# Patient Record
Sex: Female | Born: 1985 | Race: Black or African American | Hispanic: No | Marital: Single | Smoking: Never smoker
Health system: Southern US, Community
[De-identification: ages and names within clinical notes are randomized; demographics above are authoritative.]

## PROBLEM LIST (undated history)

## (undated) DIAGNOSIS — N319 Neuromuscular dysfunction of bladder, unspecified: Secondary | ICD-10-CM

## (undated) DIAGNOSIS — Q059 Spina bifida, unspecified: Secondary | ICD-10-CM

## (undated) DIAGNOSIS — R2689 Other abnormalities of gait and mobility: Secondary | ICD-10-CM

## (undated) DIAGNOSIS — H04129 Dry eye syndrome of unspecified lacrimal gland: Secondary | ICD-10-CM

## (undated) DIAGNOSIS — E559 Vitamin D deficiency, unspecified: Secondary | ICD-10-CM

## (undated) DIAGNOSIS — N3281 Overactive bladder: Secondary | ICD-10-CM

## (undated) DIAGNOSIS — E612 Magnesium deficiency: Secondary | ICD-10-CM

## (undated) DIAGNOSIS — R633 Feeding difficulties, unspecified: Secondary | ICD-10-CM

## (undated) DIAGNOSIS — R41841 Cognitive communication deficit: Secondary | ICD-10-CM

## (undated) DIAGNOSIS — K219 Gastro-esophageal reflux disease without esophagitis: Secondary | ICD-10-CM

## (undated) DIAGNOSIS — M6281 Muscle weakness (generalized): Secondary | ICD-10-CM

## (undated) DIAGNOSIS — R278 Other lack of coordination: Secondary | ICD-10-CM

## (undated) DIAGNOSIS — R471 Dysarthria and anarthria: Secondary | ICD-10-CM

## (undated) DIAGNOSIS — R1310 Dysphagia, unspecified: Secondary | ICD-10-CM

## (undated) HISTORY — DX: Feeding difficulties, unspecified: R63.30

## (undated) HISTORY — PX: SHUNT REPLACEMENT: SHX5403

## (undated) HISTORY — PX: EYE SURGERY: SHX253

## (undated) HISTORY — DX: Dysphagia, unspecified: R13.10

## (undated) HISTORY — PX: BLADDER SURGERY: SHX569

## (undated) HISTORY — DX: Gastro-esophageal reflux disease without esophagitis: K21.9

## (undated) HISTORY — DX: Overactive bladder: N32.81

## (undated) HISTORY — DX: Vitamin D deficiency, unspecified: E55.9

## (undated) HISTORY — DX: Neuromuscular dysfunction of bladder, unspecified: N31.9

## (undated) HISTORY — DX: Dry eye syndrome of unspecified lacrimal gland: H04.129

## (undated) HISTORY — DX: Feeding difficulties: R63.3

## (undated) HISTORY — DX: Cognitive communication deficit: R41.841

## (undated) HISTORY — DX: Spina bifida, unspecified: Q05.9

## (undated) HISTORY — PX: BACK SURGERY: SHX140

## (undated) HISTORY — DX: Other abnormalities of gait and mobility: R26.89

## (undated) HISTORY — DX: Dysarthria and anarthria: R47.1

## (undated) HISTORY — DX: Muscle weakness (generalized): M62.81

## (undated) HISTORY — DX: Other lack of coordination: R27.8

## (undated) HISTORY — PX: FOOT SURGERY: SHX648

## (undated) HISTORY — DX: Magnesium deficiency: E61.2

---
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
## 2007-05-31 IMAGING — CR DG CHEST 1V PORT
1 series · 1 of 1 positions shown · non-contrast
Comparison: 10/26/05.

CLINICAL DATA: 20 year old female; fever.
 PORTABLE CHEST - 1 VIEW - 01/25/06:

[view not recorded]
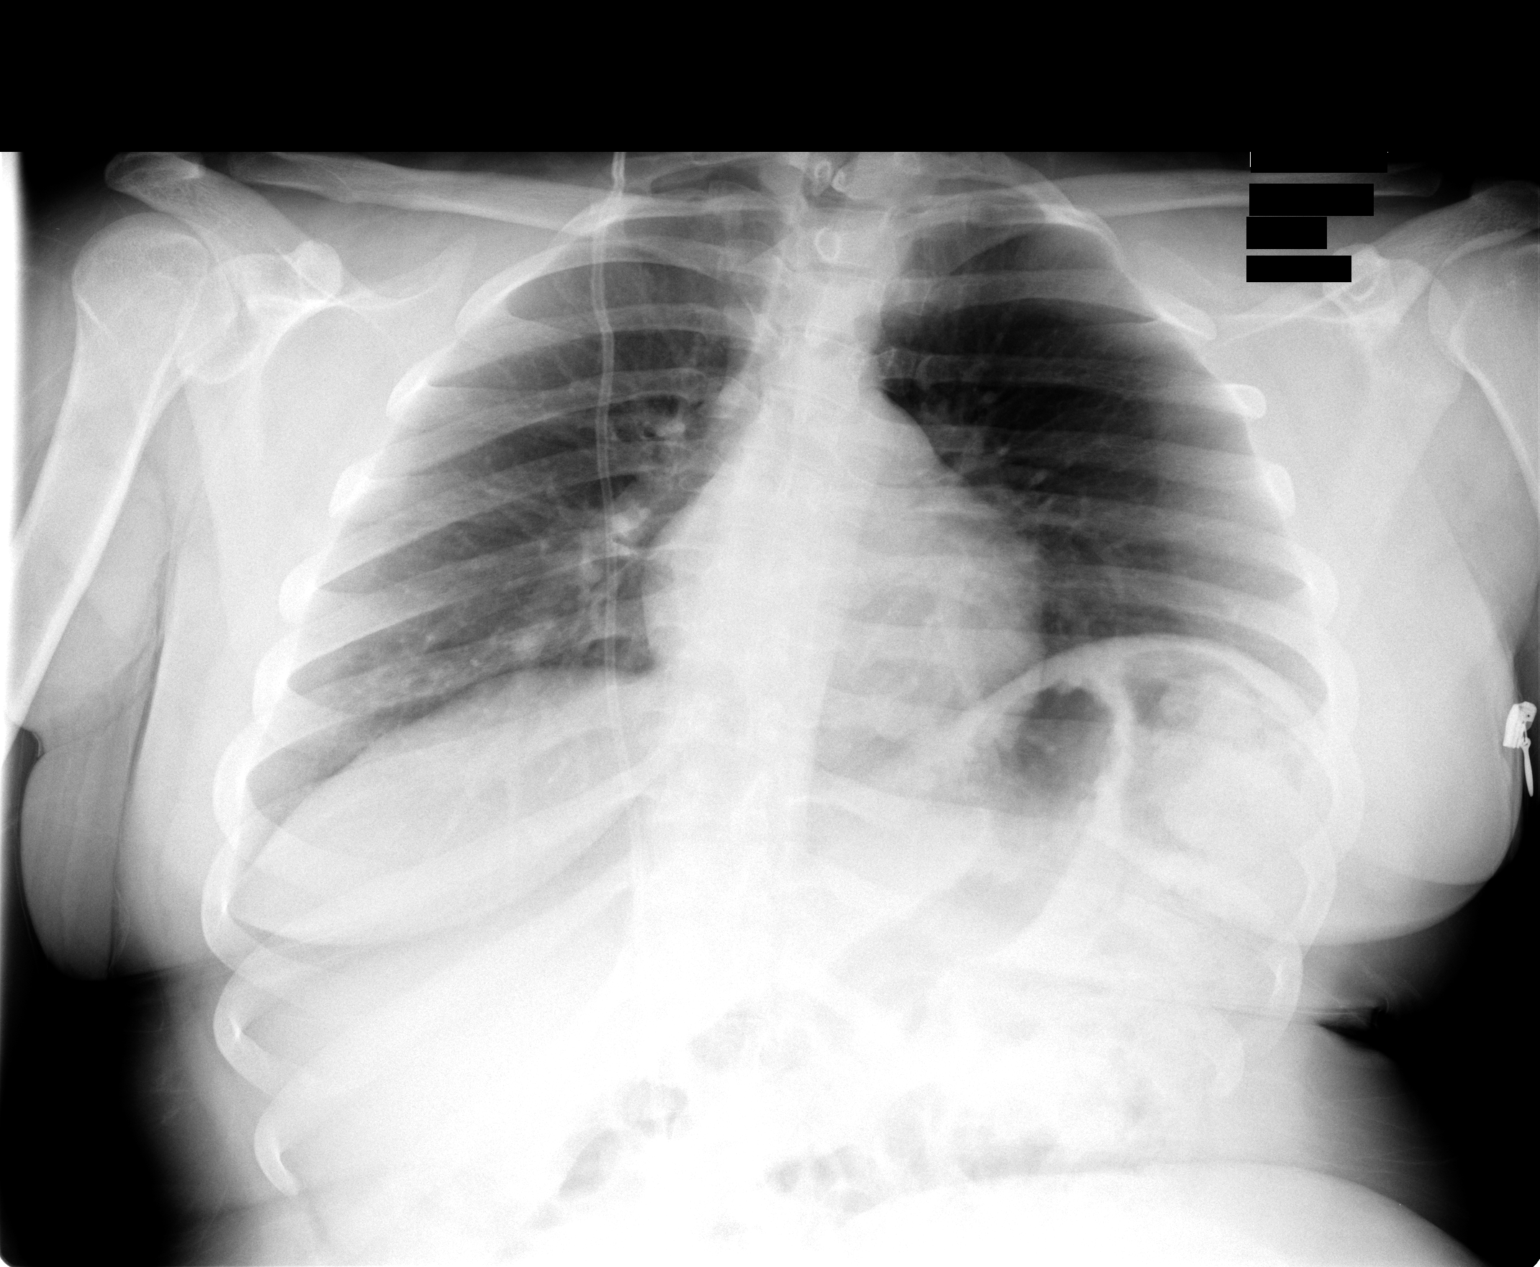

[1 of 1 positions shown; findings below may reference images not displayed]

FINDINGS: The cardiopericardial silhouette is within normal limits for size.   The right-sided PICC line has been removed.  There is ill-defined air space disease involving the right lower lobe.  No other focal air space disease is evident.  The visualized soft tissues and bony thorax are unremarkable.
IMPRESSION: 1.  Ill-defined air space disease in the right lower lobe may represent early infection versus atelectasis.
 2.  V-P shunt catheter in place.
 3.  Interval removal of right-sided PICC line.

## 2007-06-13 ENCOUNTER — Emergency Department (HOSPITAL_COMMUNITY): Admission: EM | Admit: 2007-06-13 | Discharge: 2007-06-13 | Payer: Self-pay | Admitting: Emergency Medicine

## 2007-06-24 ENCOUNTER — Encounter (HOSPITAL_BASED_OUTPATIENT_CLINIC_OR_DEPARTMENT_OTHER): Admission: RE | Admit: 2007-06-24 | Discharge: 2007-09-22 | Payer: Self-pay | Admitting: Surgery

## 2008-11-09 ENCOUNTER — Encounter (HOSPITAL_COMMUNITY): Admission: RE | Admit: 2008-11-09 | Discharge: 2008-12-09 | Payer: Self-pay | Admitting: Pulmonary Disease

## 2008-12-29 ENCOUNTER — Encounter (HOSPITAL_COMMUNITY): Admission: RE | Admit: 2008-12-29 | Discharge: 2009-01-28 | Payer: Self-pay | Admitting: Pulmonary Disease

## 2009-07-08 ENCOUNTER — Encounter (HOSPITAL_COMMUNITY): Admission: RE | Admit: 2009-07-08 | Discharge: 2009-08-07 | Payer: Self-pay | Admitting: Pulmonary Disease

## 2009-08-09 ENCOUNTER — Encounter (HOSPITAL_COMMUNITY): Admission: RE | Admit: 2009-08-09 | Discharge: 2009-09-08 | Payer: Self-pay | Admitting: Pulmonary Disease

## 2009-09-09 ENCOUNTER — Encounter (HOSPITAL_COMMUNITY): Admission: RE | Admit: 2009-09-09 | Discharge: 2009-10-09 | Payer: Self-pay | Admitting: Pulmonary Disease

## 2009-10-14 ENCOUNTER — Emergency Department (HOSPITAL_COMMUNITY): Admission: EM | Admit: 2009-10-14 | Discharge: 2009-10-14 | Payer: Self-pay | Admitting: Emergency Medicine

## 2010-06-18 ENCOUNTER — Encounter: Payer: Self-pay | Admitting: Surgery

## 2010-06-19 ENCOUNTER — Encounter: Payer: Self-pay | Admitting: Surgery

## 2010-10-11 NOTE — Assessment & Plan Note (Signed)
[facility]   NAME:  Kristen Stewart, Kristen Stewart NO.:  [id number]   MEDICAL RECORD NO.:  [id number]      DATE OF BIRTH:  1986/04/09   PHYSICIAN:  Theresia Majors. Tanda Stewart, M.D.      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  In the interim, we  treated her with off-loading and Iodosorb gel.  In the interim, there  has been no excessive drainage, malodor, pain, or fever.  She returns  for followup.  She is accompanied by her attendant.   OBJECTIVE:  Blood pressure is 106/64, respirations 16, pulse rate 77,  temperature 98.3.  Inspection of the ulcer shows that there is a punctate area of recent  epithelization.  The wound is 100% epithelized.  There is a continuation  of the Iodosorb, which is impacted in desquamation.  The wound was left  undisturbed.  There is no evidence of abscess or ascending cellulitis or  lymphangitis.   ASSESSMENT:  Resolved neuropathic ulcer.   PLAN:  We are discharging the patient with instructions that she is to  continue to use the bulky wrap and the protective off-loading sandal.  Particular attention should be taken during night to place a pillow in  such a way as to avoid unusual friction.  We have given the patient and  her attendant an opportunity to ask questions.  They seemed to  understand.  They expressed gratitude for having been seen in the  clinic.  The patient is discharged.      Kristen Stewart, M.D.  Electronically Signed     HAN/MEDQ  D:  08/15/2007  T:  08/15/2007  Job:  [id number]   cc:   Kristen Stewart, M.D.

## 2010-10-11 NOTE — Assessment & Plan Note (Signed)
[facility]   NAME:  Kristen Stewart, Kristen Stewart NO.:  [id number]   MEDICAL RECORD NO.:  [id number]      DATE OF BIRTH:  Oct 15, 1985   PHYSICIAN:  Theresia Majors. Tanda Rockers, M.D. VISIT DATE:  07/11/2007                                   OFFICE VISIT   SUBJECTIVE:  Kristen Stewart is a 25 year old lady who we are treating  for neuropathic ulcer involving the left hallux.  In the interim she has  been treated with daily antiseptic soap wash and topical Iodosorb with a  bulky dressing.  There has been no fever or excessive drainage.   OBJECTIVE:  Blood pressure is 118/67, respirations 16, pulse rate 90,  temperature 98.1.  Inspection of the left foot shows that the ulcer is  essentially unchanged.  There is a halo of callus which was pared  without difficulty.  The wound itself is 100% granulated with scant  advancing epithelium.  There is no excessive edema.  The pedal pulse is  readily palpable.  There is brisk capillary refill.   ASSESSMENT:  Inadequate offloading, neuropathic ulcer.   PLAN:  We have instructed the caretaker to be vigilant in avoiding  pressure and dressing changes to ensure that there is adequate bulk to  provide protection.  We have renewed the prescription for Iodosorb.  We  will  reevaluate the patient in one month.      Harold A. Tanda Rockers, M.D.  Electronically Signed     HAN/MEDQ  D:  07/11/2007  T:  07/13/2007  Job:  [id number]

## 2010-10-11 NOTE — Assessment & Plan Note (Signed)
[facility]   NAME:  RYAN, PALERMO NO.:  [id number]   MEDICAL RECORD NO.:  [id number]      DATE OF BIRTH:  18-Jan-1986   PHYSICIAN:  Theresia Majors. Tanda Rockers, M.D. VISIT DATE:  06/26/2007                                   OFFICE VISIT   SUBJECTIVE:  Ms. Kristen Stewart is a 25 year old who was last seen in the  Wound Center in November 2007.  At that time she was being treated for  recalcitrant osteomyelitis.  The patient was referred to [facility]  and underwent rotational flap surgery with complete resolution of this  problem.  She returns with an ulceration which has developed on the left  hallux that this been present for approximately 3 weeks.  It has been  treated with a local cleansing and a topical tri-antibiotic ointment.  There has been no associated fever.  She continues in a group residence.   OBJECTIVE:  Blood pressure is 110/63, respirations 18, pulse rate 85,  temperature is 98.2.  She is accompanied by her caretaker.  Examination of the lower extremities shows atrophic changes consistent  with her spina bifida.  On the left hallux, there is a well-  circumscribed ulcer at the interphalangeal joint laterally.  It is  surrounded by a preponderance of callus and nonviable necrotic tissue.  The central portion of the wound appears to be fully granulated.  There  is moist exudate.  There is no a ascending infection.  The pedal pulse  is readily palpable.  The patient is insensate.  An excisional  debridement of nonviable tissue including skin and the subcutaneous area  was performed without difficulty.  Minimum hemorrhage was stimulated and  controlled with pressure.   ASSESSMENT:  Neuropathic ulcer.   PLAN:  We will continue antiseptic soap washing daily with topical  application of Iodosorb.  We will place the patient in the healing  sandal with a bulky dressing to avoid pressure and friction.  We have  explained this dressing  technique to the caretaker.  It has been  demonstrated by the nurse.  We have given the patient and the care taker  an opportunity to ask questions.  They seem to understand and indicate  that they will be compliant.  We will reevaluate the patient in 2 weeks.      Harold A. Tanda Rockers, M.D.  Electronically Signed     HAN/MEDQ  D:  06/26/2007  T:  06/26/2007  Job:  [id number]   cc:   Ramon Dredge L. Juanetta Gosling, M.D.

## 2010-10-14 NOTE — H&P (Signed)
NAME:  Kristen Stewart, Kristen Stewart           ACCOUNT NO.:  [id number]   MEDICAL RECORD NO.:  [id number]          PATIENT TYPE:  INP   LOCATION:  A302                          FACILITY:  [facility]   PHYSICIAN:  Melvyn Novas, MDDATE OF BIRTH:  [birth year]   DATE OF ADMISSION:  05/21/2006  DATE OF DISCHARGE:  LH                              HISTORY & PHYSICAL   The patient is an 25 year old black female with spina bifida, chronic  and recurrent UTIs with indwelling Foley, possible neurogenic bladder  who apparently was admitted 10 days ago for UTI and urosepsis and placed  on Cipro and then developed some diarrhea and was 3 days ago placed on  Flagyl presumably to cover C. difficile.  She continues to have  intractable diarrhea, several episodes per hour.  She denies any nausea,  vomiting, melena, hematochezia or hematemesis.  The patient has blood  pressure of 98 systolic and will be admitted for control of intractable  diarrhea, culture of stools and aggressive antidiarrheal therapy as well  as intravenous fluid repletion.  She still has some chronic dysuria,  although she has an indwelling Foley.  We will cease Cipro at present.   PAST MEDICAL HISTORY:  1. Significant for spina bifida.  2. Neurogenic bladder.  3. Chronic recurrent UTIs.  4. GERD.  5. Dyspepsia.   PAST SURGICAL HISTORY:  1. Remarkable for surgery on her left eye at birth.  2. Cystoscopy within the last several years.   She has no known allergies.   CURRENT MEDICATIONS:  1. Baclofen 10 mg q.8h.  2. Zantac 150 p.o. b.i.d.  3. Cipro 500 b.i.d.  4. Flagyl 500 mg b.i.d.   Blood pressure is 98/56, respiratory rate of 16, pulse of 88, O2  saturation is 98%.  Eyes are PERRLA.  Extraocular movements intact.  Sclerae clear.  Conjunctivae pink.  Throat shows no erythema and no  exudates.  Tongue has a somewhat dry mucosa.  Neck shows no JVD, no  carotid bruits, no thyromegaly and no thyroid bruits.  Lungs are clear  to  A&P.  No rales, wheezes or rhonchi appreciable.  Heart regular rate  and rhythm.  No murmurs, gallops, heaves, thrills or rubs.  Abdomen is  obese, soft, nontender bowel sounds.  No hepatosplenomegaly, no masses.  Extremities show no clubbing, cyanosis, or edema.   IMPRESSION:  1. Intractable diarrhea.  2. Status post urinary tract infection with Cipro and the addition of      Flagyl.  3. Consider opportunistic infection such as Clostridium difficile.  4. Spina bifida.  5. Chronic recurring urinary tract infection with indwelling catheter.  6. Neurogenic bladder.   The plan is to admit, get stool cultures, give Lomotil 1 every 4 hours  p.r.n. IV and normal saline.  Get serial BMETs daily.  Culture stools  and a regular diet as tolerated.  I will make further recommendations as  the data base expands.      Melvyn Novas, MD  Electronically Signed     RMD/MEDQ  D:  05/21/2006  T:  05/22/2006  Job:  [id number]

## 2010-10-14 NOTE — Group Therapy Note (Signed)
NAME:  CARINE, NORDGREN           ACCOUNT NO.:  [id number]   MEDICAL RECORD NO.:  [id number]          PATIENT TYPE:  INP   LOCATION:  A302                          FACILITY:  [facility]   PHYSICIAN:  Edward L. Juanetta Gosling, M.D.DATE OF BIRTH:  December 08, 1985   DATE OF PROCEDURE:  DATE OF DISCHARGE:                                 PROGRESS NOTE   SUBJECTIVE:  Ms. Regas says she is feeling better.  She is having  formed stools now.  Thus far her studies for C diff, etc. are normal.   PHYSICAL EXAMINATION:  GENERAL:  Her physical exam today shows she is  awake and alert.  VITAL SIGNS:  Temperature is 98, pulse 89, respirations 22, blood  pressure 101/54, O2 sats 99%.  ABDOMEN:  Soft.  CHEST:  Clear.  EXTREMITIES:  No edema.   ASSESSMENT:  She is better.   PLAN:  Continue treatments.  I am going to discuss with Dr. Kendell Bane since  she is resolving her diarrhea whether we need to continue antibiotics or  exactly what we need to do.      Edward L. Juanetta Gosling, M.D.  Electronically Signed     ELH/MEDQ  D:  05/24/2006  T:  05/24/2006  Job:  [id number]

## 2010-10-14 NOTE — Consult Note (Signed)
NAME:  Kristen Stewart, Kristen Stewart           ACCOUNT NO.:  [id number]   MEDICAL RECORD NO.:  [id number]          PATIENT TYPE:  INP   LOCATION:  A302                          FACILITY:  [facility]   PHYSICIAN:  R. Roetta Sessions, M.D. DATE OF BIRTH:  06/15/1985   DATE OF CONSULTATION:  DATE OF DISCHARGE:                                 CONSULTATION   REASON FOR CONSULTATION:  Diarrhea.   HISTORY OF PRESENT ILLNESS:  Patient is an 25 year old African-American  female with history of spina bifida and neurogenic bladder who has  recurrent urinary tract infections.  She had 1 earlier this month, she  was placed on antibiotics.  She then began to complain of diarrhea which  began around December 16.  She has had watery diarrhea with stomach  cramping 4 to 5 times per day since that time.  She has some complaints  of left upper quadrant, just below the left rib cage, pain.  She denies  any nausea, she did vomit one time.  She has had a low grade temp up to  102 but this began with urinary tract infection and was better when she  started antibiotics.  She has history of constipation previously.  Stool  culture and C-diff from May 18, 2006 was negative.  She was started  on Flagyl for a couple of days prior to hospital admission.  She has not  had any further diarrhea since she has been admitted.   PAST MEDICAL/SURGICAL HISTORY:  Spina bifida, neurogenic bladder with  recurrent urinary tract infections, she has an indwelling catheter,  GERD, dyspepsia, left eye surgery, she is status post cystoscopy, on  December 14 she had blood transfusion, she was started on iron, she has  a history of previous constipation.   MEDICATIONS PRIOR TO ADMISSION:  __________ 5 mg daily, ranitidine 150  mg b.i.d., multi-vitamin daily, baclofen 10 mg p.r.n.   ALLERGIES:  __________ .   FAMILY HISTORY:  Mother is alive at age 19, she is a substance abuser.  Patient does not have contact with her father, patient does  not know  history.  She has 4 healthy siblings.  There is no family history of  inflammatory bowel disease.   SOCIAL HISTORY:  Patient lives with her brother and his wife.  She is  single, denies any tobacco or alcohol or drug use.   REVIEW OF SYSTEMS:  CONSTITUTIONAL:  She has daily gained some weight  since she has moved in with her brother, she was previously underweight.  CARDIOVASCULAR:  She is having some left lower anterior chest pain,  denies any palpitations.  Denies any dizziness or diaphoresis.  PULMONARY:  Denies any shortness of breath, cough or hemoptysis.  GASTROINTESTINAL:  See HPI.  Has a history of heartburn, well controlled  on ranitidine.  Denies any dysphagia, odynophagia, anorexia or early  satiety.  GYN:  Last menstrual period was on December 23.   PHYSICAL EXAM:  VITAL SIGNS:  Weight, actually I don't have a weight on  her.  Temp  98.1, pulse 89, respirations 22, blood pressure 108/73, O2  sat's 99% on room air.  GENERAL:  Patient is alert and oriented.  Well developed female in no  acute distress.  HEENT:  __________  NECK:  Supple without masses.  CHEST:  HEART:  Regular rate and rhythm, normal S1/S2, no murmurs or gallops.  LUNGS:  Clear to auscultation bilaterally.  ABDOMEN:  Positive bowel sounds.  She does have a __________ around the  umbilicus consistent with previous surgeries.  Abdomen is soft,  nontender, nondistended without palpable masses or guarding.  EXTREMITIES:  Lower extremities are unequal, they are without edema or  clubbing.   LABORATORY STUDIES:  From May 21, 2006:  WBC's 4.5, hemoglobin 1.8,  hematocrit 38, platelets 604 from December 25, calcium 8.3, sodium 140,  potassium 4.3, chloride 109, CO2 of 25, BUN 6, creatinine 0.5 and  glucose 134.   IMPRESSION:  This is an 25 year old African-American female with history  of neurogenic bladder and spina bifida with recent urinary tract  infection treated on antibiotics.  She has  had a 10 day history of  watery diarrhea 4 to 5 loose nonbloody stools a day.  Status post  __________ she has had 1 C-difficile from December 21 which was  negative.  I suspect she either has antibiotic induced diarrhea or  __________ but cannot rule out inflammatory bowel disease at this time.   PLAN:  1. We will follow up with stool studies.  2. If diarrhea persists, she is going to need a colonoscopy.  3. Supportive measures.  4. I want to thank Dr. Juanetta Gosling for allowing [place] to participate in the      care of this patient.      Nicholas Lose, N.P.      Jonathon Bellows, M.D.  Electronically Signed    KC/MEDQ  D:  05/23/2006  T:  05/23/2006  Job:  [id number]   cc:   Ramon Dredge L. Juanetta Gosling, M.D.  Fax: [phone]

## 2010-10-14 NOTE — Discharge Summary (Signed)
NAME:  Kristen Stewart, Kristen Stewart           ACCOUNT NO.:  [id number]   MEDICAL RECORD NO.:  [id number]          PATIENT TYPE:  INP   LOCATION:  A302                          FACILITY:  [facility]   PHYSICIAN:  Edward L. Juanetta Gosling, M.D.DATE OF BIRTH:  03-12-1986   DATE OF ADMISSION:  05/21/2006  DATE OF DISCHARGE:  12/27/2007LH                               DISCHARGE SUMMARY   DISCHARGE DIAGNOSES:  1. Antibiotic-associated diarrhea.  2. History of multiple urinary tract infections requiring antibiotics.  3. Spina bifida.  4. Neurogenic bladder.  5. 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  6. Iron-deficiency anemia.   This is an 25 year old who has a history of spina bifida, chronic and  recurrent UTIs, neurogenic bladder, large decubitus on her sacrum, and  who was admitted with diarrhea.  She had tried antidiarrheal therapy,  and it was of no help.  She was having stools on an hourly basis when  she came to the emergency room.   Her exam on admission showed the pressure was 98/56, respirations 16,  pulse 88, O2 sat 98%.  She had the large decubitus seen.  She had an indwelling catheter.  She  appeared to be mildly dehydrated.   HOSPITAL COURSE:  She was admitted with an impression of intractable  diarrhea, probably antibiotic-associated.  She had workup for C.  difficile, and this was negative.  She was placed on Flagyl and  improved, and by the time of discharge, she was much improved with  formed stools.  She was discharged home on Flagyl 250 mg 4 times a day,  Baclofen 10 mg every 8 hours, Zantac 150 mg b.i.d., and she is going to  continue with her other treatment.      Edward L. Juanetta Gosling, M.D.  Electronically Signed     ELH/MEDQ  D:  05/24/2006  T:  05/24/2006  Job:  [id number]

## 2016-06-22 ENCOUNTER — Other Ambulatory Visit: Payer: Self-pay | Admitting: Adult Health

## 2016-07-06 ENCOUNTER — Other Ambulatory Visit: Payer: Self-pay | Admitting: Adult Health

## 2016-07-26 ENCOUNTER — Other Ambulatory Visit (HOSPITAL_COMMUNITY): Payer: Self-pay | Admitting: Internal Medicine

## 2016-07-26 DIAGNOSIS — R51 Headache: Principal | ICD-10-CM

## 2016-07-26 DIAGNOSIS — R519 Headache, unspecified: Secondary | ICD-10-CM

## 2016-07-27 ENCOUNTER — Encounter: Payer: Self-pay | Admitting: Adult Health

## 2016-07-27 ENCOUNTER — Ambulatory Visit (INDEPENDENT_AMBULATORY_CARE_PROVIDER_SITE_OTHER): Payer: Medicare Other | Admitting: Adult Health

## 2016-07-27 ENCOUNTER — Other Ambulatory Visit (HOSPITAL_COMMUNITY)
Admission: RE | Admit: 2016-07-27 | Discharge: 2016-07-27 | Disposition: A | Discharge status: Home / Self Care | Payer: Medicare Other | Source: Ambulatory Visit | Attending: Adult Health | Admitting: Adult Health

## 2016-07-27 VITALS — BP 120/80 | HR 78

## 2016-07-27 DIAGNOSIS — Z1151 Encounter for screening for human papillomavirus (HPV): Secondary | ICD-10-CM | POA: Diagnosis present

## 2016-07-27 DIAGNOSIS — Z7689 Persons encountering health services in other specified circumstances: Secondary | ICD-10-CM | POA: Insufficient documentation

## 2016-07-27 DIAGNOSIS — Z124 Encounter for screening for malignant neoplasm of cervix: Secondary | ICD-10-CM

## 2016-07-27 DIAGNOSIS — Z01419 Encounter for gynecological examination (general) (routine) without abnormal findings: Secondary | ICD-10-CM | POA: Insufficient documentation

## 2016-07-27 MED ORDER — NORETHIN-ETH ESTRAD-FE BIPHAS 1 MG-10 MCG / 10 MCG PO TABS: 1.0000 | ORAL_TABLET | Freq: Every day | ORAL | 11 refills | Status: DC
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
## 2016-07-27 NOTE — Patient Instructions (Addendum)
Start lo loestrin today F/U with me in 3 months Physical in 1year Pap in 3 if normal

## 2016-07-28 ENCOUNTER — Emergency Department (HOSPITAL_COMMUNITY): Payer: Medicare Other

## 2016-07-28 ENCOUNTER — Emergency Department (HOSPITAL_COMMUNITY)
Admission: EM | Admit: 2016-07-28 | Discharge: 2016-07-28 | Disposition: A | Discharge status: Home / Self Care | Payer: Medicare Other | Attending: Emergency Medicine | Admitting: Emergency Medicine

## 2016-07-28 ENCOUNTER — Encounter (HOSPITAL_COMMUNITY): Payer: Self-pay | Admitting: Emergency Medicine

## 2016-07-28 DIAGNOSIS — Z79899 Other long term (current) drug therapy: Secondary | ICD-10-CM | POA: Diagnosis not present

## 2016-07-28 DIAGNOSIS — G4489 Other headache syndrome: Secondary | ICD-10-CM | POA: Insufficient documentation

## 2016-07-28 DIAGNOSIS — Z91040 Latex allergy status: Secondary | ICD-10-CM | POA: Insufficient documentation

## 2016-07-28 DIAGNOSIS — R51 Headache: Secondary | ICD-10-CM

## 2016-07-28 DIAGNOSIS — R519 Headache, unspecified: Secondary | ICD-10-CM

## 2016-07-28 LAB — CBC WITH DIFFERENTIAL/PLATELET
BASOS ABS: 0 10*3/uL (ref 0.0–0.1)
BASOS PCT: 0 %
EOS ABS: 0.1 10*3/uL (ref 0.0–0.7)
Eosinophils Relative: 1 %
HEMATOCRIT: 41.5 % (ref 36.0–46.0)
HEMOGLOBIN: 13.4 g/dL (ref 12.0–15.0)
Lymphocytes Relative: 26 %
Lymphs Abs: 2.4 10*3/uL (ref 0.7–4.0)
MCH: 27.3 pg (ref 26.0–34.0)
MCHC: 32.3 g/dL (ref 30.0–36.0)
MCV: 84.5 fL (ref 78.0–100.0)
MONOS PCT: 5 %
Monocytes Absolute: 0.5 10*3/uL (ref 0.1–1.0)
NEUTROS ABS: 6.4 10*3/uL (ref 1.7–7.7)
NEUTROS PCT: 68 %
Platelets: 232 10*3/uL (ref 150–400)
RBC: 4.91 MIL/uL (ref 3.87–5.11)
RDW: 13.7 % (ref 11.5–15.5)
WBC: 9.3 10*3/uL (ref 4.0–10.5)

## 2016-07-28 LAB — CYTOLOGY - PAP
Diagnosis: NEGATIVE
HPV (WINDOPATH): NOT DETECTED

## 2016-07-28 LAB — BASIC METABOLIC PANEL
ANION GAP: 7 (ref 5–15)
BUN: 17 mg/dL (ref 6–20)
CHLORIDE: 106 mmol/L (ref 101–111)
CO2: 26 mmol/L (ref 22–32)
CREATININE: 0.44 mg/dL (ref 0.44–1.00)
Calcium: 9.3 mg/dL (ref 8.9–10.3)
GFR calc non Af Amer: >60 mL/min (ref >60)
Glucose, Bld: 86 mg/dL (ref 65–99)
POTASSIUM: 3.9 mmol/L (ref 3.5–5.1)
SODIUM: 139 mmol/L (ref 135–145)

## 2016-07-28 MED ORDER — METOCLOPRAMIDE HCL 10 MG PO TABS
10.0000 mg | ORAL_TABLET | Freq: Once | ORAL | Status: AC
  Administered 2016-07-28: 10 mg via ORAL
  Filled 2016-07-28: qty 1

## 2016-07-28 MED ORDER — IBUPROFEN 800 MG PO TABS
800.0000 mg | ORAL_TABLET | Freq: Once | ORAL | Status: AC
  Administered 2016-07-28: 800 mg via ORAL
  Filled 2016-07-28: qty 1

## 2016-07-28 MED ORDER — METOCLOPRAMIDE HCL 5 MG/ML IJ SOLN
10.0000 mg | Freq: Once | INTRAMUSCULAR | Status: DC
  Filled 2016-07-28: qty 2

## 2016-07-28 MED ORDER — DIPHENHYDRAMINE HCL 50 MG/ML IJ SOLN
12.5000 mg | Freq: Once | INTRAMUSCULAR | Status: DC
  Filled 2016-07-28: qty 1

## 2016-07-28 MED ORDER — KETOROLAC TROMETHAMINE 30 MG/ML IJ SOLN
30.0000 mg | Freq: Once | INTRAMUSCULAR | Status: DC
  Filled 2016-07-28: qty 1

## 2016-07-28 MED ORDER — DIPHENHYDRAMINE HCL 25 MG PO CAPS
25.0000 mg | ORAL_CAPSULE | Freq: Once | ORAL | Status: AC
  Administered 2016-07-28: 25 mg via ORAL
  Filled 2016-07-28: qty 1

## 2016-07-28 NOTE — Discharge Instructions (Signed)
Keep your appt with your doctor for next week.  Return here for any worsening symptoms.  Tylenol every 4 hrs if needed

## 2016-07-28 NOTE — ED Notes (Signed)
[facility]

## 2016-07-28 NOTE — ED Triage Notes (Signed)
[facility] A/o.  Hx of spinabifida and has shunt. Wants shunt checked due to h/a x 6 days with no relief from meds that has been given.

## 2016-07-28 NOTE — ED Notes (Signed)
Pt states she has been abstinance from sexual intercourse over 6 months. Pa aware and radiology aware

## 2016-07-28 NOTE — ED Notes (Signed)
Call[facility] EMS [facility].  Dispatcher states"may be awhile".  Nurse informed.

## 2016-07-28 NOTE — ED Notes (Signed)
Pt taken to xray 

## 2016-07-28 NOTE — ED Provider Notes (Signed)
[facility]-EM[facility]Y DEPT Provider Note   CSN: [id number] Arrival date & time: 07/28/16  0948     History   Chief Complaint Chief Complaint  Patient presents with  . Headache    HPI Kristen Stewart is a 31 y.o. female.  HPI   Kristen Stewart is a 31 y.o. female with hx of spina bifida and VP shunt, resides at assisted living facility, who presents to the Emergency Department complaining of headache for 6 days.  She describes a throbbing pain to her forehead and top of her head.  Headache was gradual onset and not relieved despite tylenol and ibuprofen.  She denies neck pain or stiffness, fever, chills, visual changes or vomiting.     Past Medical History:  Diagnosis Date  . Dry eye syndrome of unspecified lacrimal gland   . Dysarthria   . Dysphagia   . Feeding difficulties   . GERD without esophagitis   . Magnesium deficiency   . Muscle weakness (generalized)   . Neuromuscular dysfunction of bladder   . Other abnormalities of gait and mobility   . Other lack of coordination   . Overactive bladder   . Spina bifida (HCC)   . Vitamin D deficiency     Patient Active Problem List   Diagnosis Date Noted  . Encounter for menstrual regulation 07/27/2016    Past Surgical History:  Procedure Laterality Date  . BACK SURGERY    . BLADDER SURGERY    . EYE SURGERY Left   . FOOT SURGERY Bilateral   . SHUNT REPLACEMENT      OB History    Gravida Para Term Preterm AB Living   0 0 0 0 0 0   SAB TAB Ectopic Multiple Live Births   0 0 0 0 0       Home Medications    Prior to Admission medications   Medication Sig Start Date End Date Taking? Authorizing Provider  Cholecalciferol (VITAMIN D-3) 5000 units TABS Take 1 tablet by mouth daily.    Yes Historical Provider, MD  Cranberry 425 MG CAPS Take by mouth daily.   Yes Historical Provider, MD  HYDROcodone-acetaminophen (NORCO/VICODIN) 5-325 MG tablet Take 1 tablet by mouth every 6 (six) hours as needed for moderate  pain.   Yes Historical Provider, MD  magnesium oxide (MAG-OX) 400 MG tablet Take 400 mg by mouth daily.   Yes Historical Provider, MD  Multiple Vitamins-Minerals (CEROVITE PO) Take 1 tablet by mouth daily.    Yes Historical Provider, MD  nitrofurantoin (MACRODANTIN) 100 MG capsule Take 100 mg by mouth at bedtime.   Yes Historical Provider, MD  Norethindrone-Ethinyl Estradiol-Fe Biphas (LO LOESTRIN FE) 1 MG-10 MCG / 10 MCG tablet Take 1 tablet by mouth daily. Take 1 daily by mouth 07/27/16  Yes Adline PotterJennifer A Griffin, NP  omeprazole (PRILOSEC) 20 MG capsule Take 20 mg by mouth daily.   Yes Historical Provider, MD  ondansetron (ZOFRAN) 4 MG tablet Take 4 mg by mouth every 8 (eight) hours as needed for nausea or vomiting.   Yes Historical Provider, MD  oseltamivir (TAMIFLU) 75 MG capsule Take 75 mg by mouth daily.   Yes Historical Provider, MD  oxybutynin (DITROPAN-XL) 10 MG 24 hr tablet Take 10 mg by mouth at bedtime.   Yes Historical Provider, MD  polyethylene glycol (MIRALAX / GLYCOLAX) packet Take 17 g by mouth as needed for moderate constipation.    Yes Historical Provider, MD  Propylene Glycol (SYSTANE BALANCE OP) Apply 1 drop  to eye 3 (three) times daily.   Yes Historical Provider, MD  sertraline (ZOLOFT) 25 MG tablet Take 25 mg by mouth daily.   Yes Historical Provider, MD    Family History Family History  Problem Relation Age of Onset  . Breast cancer Paternal Grandmother   . Cancer Father     prostate    Social History Social History  Substance Use Topics  . Smoking status: Never Smoker  . Smokeless tobacco: Never Used  . Alcohol use No     Allergies   Diamox [acetazolamide]; Lactulose; and Latex   Review of Systems Review of Systems  Constitutional: Negative for activity change, appetite change and fever.  HENT: Negative for facial swelling and trouble swallowing.   Eyes: Positive for photophobia. Negative for pain and visual disturbance.  Respiratory: Negative for shortness  of breath.   Cardiovascular: Negative for chest pain.  Gastrointestinal: Negative for nausea and vomiting.  Musculoskeletal: Negative for neck pain and neck stiffness.  Skin: Negative for rash and wound.  Neurological: Positive for headaches. Negative for dizziness, facial asymmetry, speech difficulty, weakness and numbness.  Psychiatric/Behavioral: Negative for confusion and decreased concentration.  All other systems reviewed and are negative.    Physical Exam Updated Vital Signs BP 126/79 (BP Location: Left Arm)   Pulse (!) 57   Temp 97.8 F (36.6 C) (Oral)   Resp 18   Ht 4\' 11"  (1.499 m)   Wt 49.9 kg   LMP 07/17/2016 (Approximate)   SpO2 100%   BMI 22.22 kg/m   Physical Exam  Constitutional: She is oriented to person, place, and time. She appears well-developed and well-nourished. No distress.  HENT:  Head: Normocephalic and atraumatic.  Mouth/Throat: Oropharynx is clear and moist.  Eyes: EOM are normal. Pupils are equal, round, and reactive to light.  Neck: Normal range of motion and phonation normal. Neck supple. No spinous process tenderness and no muscular tenderness present. No neck rigidity. No Kernig's sign noted.  Cardiovascular: Normal rate, regular rhythm, normal heart sounds and intact distal pulses.   No murmur heard. Pulmonary/Chest: Effort normal and breath sounds normal. No respiratory distress.  Musculoskeletal: She exhibits no edema or tenderness.  Neurological: She is alert and oriented to person, place, and time. She exhibits normal muscle tone.  Skin: Skin is warm and dry.  Psychiatric: She has a normal mood and affect.  Nursing note and vitals reviewed.    ED Treatments / Results  Labs (all labs ordered are listed, but only abnormal results are displayed) Labs Reviewed  CBC WITH DIFFERENTIAL/PLATELET  BASIC METABOLIC PANEL    EKG  EKG Interpretation None       Radiology Dg Skull 1-3 Views  Result Date: 07/28/2016 CLINICAL DATA:   Headache, spina bifida, check shunt EXAM: SKULL - 1-3 VIEW COMPARISON:  None. FINDINGS: Right parietal approach ventriculostomy catheter. Catheter extends along the right neck into the chest. No shunt catheter discontinuity is evident. IMPRESSION: No shunt catheter discontinuity is evident along the skull or neck. Electronically Signed   By: Charline Bills M.D.   On: 07/28/2016 11:19   Dg Chest 1 View  Result Date: 07/28/2016 CLINICAL DATA:  Headache x7 days, check shunt catheter EXAM: CHEST 1 VIEW COMPARISON:  05/10/2006 FINDINGS: Lungs are clear.  No pleural effusion or pneumothorax. The heart is normal in size. Shunt catheter extends along the right chest and into the upper abdomen. Additional abandoned shunt catheter laterally along the right neck/chest. No evidence of shunt catheter discontinuity.  IMPRESSION: No evidence of shunt catheter discontinuity. Electronically Signed   By: Charline Bills M.D.   On: 07/28/2016 11:20   Dg Cervical Spine 1 View  Result Date: 07/28/2016 CLINICAL DATA:  Headache x7 days, evaluate shunt catheter EXAM: CERVICAL SPINE 1 VIEW COMPARISON:  None. FINDINGS: Two shunt catheters course along the right neck. Abandoned shunt catheter courses laterally, while the patient's current shunt catheter courses medially along the right neck, adjacent to the cervical spine. No evidence of shunt catheter discontinuity. IMPRESSION: No evidence of shunt catheter discontinuity. Electronically Signed   By: Charline Bills M.D.   On: 07/28/2016 11:21   Dg Abdomen 1 View  Result Date: 07/28/2016 CLINICAL DATA:  Headache x7 days, evaluate shunt catheter EXAM: ABDOMEN - 1 VIEW COMPARISON:  None. FINDINGS: Nonobstructive bowel gas pattern. Lower lumbar scoliosis in this patient with spina bifida. Mildly dysmorphic pelvis with shallow acetabula I bilaterally. Shunt catheter extends into the right upper abdomen and terminates along the right mid abdominal wall. No evidence of shunt catheter  discontinuity. IMPRESSION: No evidence of shunt catheter discontinuity. Electronically Signed   By: Charline Bills M.D.   On: 07/28/2016 11:22   Ct Head Wo Contrast  Result Date: 07/28/2016 CLINICAL DATA:  Headache for 6 days.  History of VP shunt. EXAM: CT HEAD WITHOUT CONTRAST TECHNIQUE: Contiguous axial images were obtained from the base of the skull through the vertex without intravenous contrast. COMPARISON:  None. 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 The ventricles are in the midline and there is no ventriculomegaly. No acute intracranial findings or extra-axial fluid collections. Vascular: No hyperdense vessels or vascular calcifications. Skull: No skull fracture or bone lesions.  Prior craniotomies. Sinuses/Orbits: The paranasal sinuses and mastoid air cells are clear. The globes are intact. Other: No scalp lesions or hematoma. IMPRESSION: 1. 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 2. Ventriculoperitoneal shunt catheter in good position. No hydrocephalus. 3. No acute intracranial findings. Electronically Signed   By: Rudie Meyer M.D.   On: 07/28/2016 12:00    Procedures Procedures (including critical care time)  Medications Ordered in ED Medications  diphenhydrAMINE (BENADRYL) capsule 25 mg (25 mg Oral Given 07/28/16 1247)  metoCLOPramide (REGLAN) tablet 10 mg (10 mg Oral Given 07/28/16 1247)  ibuprofen (ADVIL,MOTRIN) tablet 800 mg (800 mg Oral Given 07/28/16 1247)     Initial Impression / Assessment and Plan / ED Course  I have reviewed the triage vital signs and the nursing notes.  Pertinent labs & imaging results that were available during my care of the patient were reviewed by me and considered in my medical decision making (see chart for details).     Pt well appearing.  No nuchal rigidity.  Labs, XR's and CT scan  reassuring.  No evidence for hydrocephalus on CT, IV medications were ordered, but pt declined requested po.  She has tolerated po fluids and ate a snack during ED stay. Feeling better, talking with family at bedside. Has appt with her doctor at [facility] next Friday.  She appears stable for d/c, return precautions discussed.    Final Clinical Impressions(s) / ED Diagnoses   Final diagnoses:  Headache  Headache disorder    New Prescriptions New Prescriptions   No medications on file     Rosey Bath 07/30/16 2208    Eber Hong, MD 08/01/16 [phone]

## 2016-08-03 ENCOUNTER — Ambulatory Visit (HOSPITAL_COMMUNITY): Payer: Medicare Other

## 2016-08-04 ENCOUNTER — Ambulatory Visit (HOSPITAL_COMMUNITY): Payer: Self-pay

## 2016-08-11 ENCOUNTER — Other Ambulatory Visit: Payer: Self-pay | Admitting: Adult Health

## 2016-08-11 MED ORDER — NORETHIN ACE-ETH ESTRAD-FE 1-20 MG-MCG(24) PO TABS: 1.0000 | ORAL_TABLET | Freq: Every day | ORAL | 11 refills | Status: DC

## 2016-08-11 NOTE — Progress Notes (Signed)
Insurance would not cover lo loestrin will change to loestrin 24 fe 1-20(blisori fe 1-20)

## 2016-09-18 ENCOUNTER — Telehealth: Payer: Self-pay | Admitting: Orthopedic Surgery

## 2016-09-18 NOTE — Telephone Encounter (Signed)
Okey Regal from [place] - ph # [phone] - requests appointment for patient for right knee pain.  States patient is wheelchair bound, however, had stood up, and is now having pain in right knee.  States not much history on patient, other than history of spina bifida.  Notes to follow in fax for our providers to review and advise.  Appointment pending

## 2016-10-02 NOTE — Telephone Encounter (Signed)
Per Esperanza Heirarol Beck at [facility], patient feeling better and no longer needs to schedule appointment.

## 2016-10-03 ENCOUNTER — Ambulatory Visit: Payer: Medicare Other | Admitting: Orthopedic Surgery

## 2016-11-02 ENCOUNTER — Ambulatory Visit: Payer: Medicare Other | Admitting: Adult Health

## 2016-11-08 ENCOUNTER — Ambulatory Visit: Payer: Medicare Other | Admitting: Adult Health

## 2016-11-22 ENCOUNTER — Ambulatory Visit (INDEPENDENT_AMBULATORY_CARE_PROVIDER_SITE_OTHER): Payer: Medicare Other | Admitting: Adult Health

## 2016-11-22 ENCOUNTER — Encounter: Payer: Self-pay | Admitting: Adult Health

## 2016-11-22 VITALS — BP 104/70 | HR 104

## 2016-11-22 DIAGNOSIS — N926 Irregular menstruation, unspecified: Secondary | ICD-10-CM

## 2016-11-22 DIAGNOSIS — Z7689 Persons encountering health services in other specified circumstances: Secondary | ICD-10-CM

## 2016-11-22 NOTE — Progress Notes (Signed)
Subjective:     Patient ID: Kristen Stewart, female   DOB: [birth year], 31 y.o.   MRN: [id number]  HPI Kristen Stewart is a 31 year old black female, single, back in follow up of starting OCs in March for period management.She has spina bifida and is in wheelchair and resides at [facility].   Review of Systems No periods on OCs Not having sex Reviewed past medical,surgical, social and family history. Reviewed medications and allergies.     Objective:   Physical Exam BP 104/70 (BP Location: Right Arm, Patient Position: Sitting, Cuff Size: Normal)   Pulse (!) 104  Skin warm and dry. Lungs: clear to ausculation bilaterally. Cardiovascular: regular rate and rhythm.    Assessment:     1. Encounter for menstrual regulation       Plan:     Continue loestrin 1-20 Return for Physical in March 2019 or prn problems

## 2017-02-22 ENCOUNTER — Other Ambulatory Visit (HOSPITAL_COMMUNITY): Payer: Self-pay | Admitting: Internal Medicine

## 2017-02-22 DIAGNOSIS — R519 Headache, unspecified: Secondary | ICD-10-CM

## 2017-02-22 DIAGNOSIS — R51 Headache: Principal | ICD-10-CM

## 2017-03-02 ENCOUNTER — Ambulatory Visit (HOSPITAL_COMMUNITY): Payer: Medicare Other

## 2017-03-19 ENCOUNTER — Ambulatory Visit (HOSPITAL_COMMUNITY): Payer: Medicare Other

## 2017-03-19 ENCOUNTER — Ambulatory Visit (HOSPITAL_COMMUNITY)
Admission: RE | Admit: 2017-03-19 | Discharge: 2017-03-19 | Disposition: A | Discharge status: Home / Self Care | Payer: Medicare Other | Source: Ambulatory Visit | Attending: Internal Medicine | Admitting: Internal Medicine

## 2017-03-19 DIAGNOSIS — R51 Headache: Secondary | ICD-10-CM | POA: Insufficient documentation

## 2017-03-19 DIAGNOSIS — Q0700 Arnold-Chiari syndrome without spina bifida or hydrocephalus: Secondary | ICD-10-CM | POA: Diagnosis not present

## 2017-03-19 DIAGNOSIS — R519 Headache, unspecified: Secondary | ICD-10-CM

## 2017-12-17 ENCOUNTER — Encounter: Payer: Self-pay | Admitting: Adult Health

## 2017-12-17 ENCOUNTER — Ambulatory Visit (INDEPENDENT_AMBULATORY_CARE_PROVIDER_SITE_OTHER): Payer: Medicare Other | Admitting: Adult Health

## 2017-12-17 VITALS — BP 115/76

## 2017-12-17 DIAGNOSIS — Z01419 Encounter for gynecological examination (general) (routine) without abnormal findings: Secondary | ICD-10-CM | POA: Diagnosis not present

## 2017-12-17 DIAGNOSIS — F329 Major depressive disorder, single episode, unspecified: Secondary | ICD-10-CM | POA: Insufficient documentation

## 2017-12-17 DIAGNOSIS — Z7689 Persons encountering health services in other specified circumstances: Secondary | ICD-10-CM

## 2017-12-17 DIAGNOSIS — Z113 Encounter for screening for infections with a predominantly sexual mode of transmission: Secondary | ICD-10-CM

## 2017-12-17 DIAGNOSIS — F32A Depression, unspecified: Secondary | ICD-10-CM

## 2017-12-17 MED ORDER — NORETHIN-ETH ESTRAD-FE BIPHAS 1 MG-10 MCG / 10 MCG PO TABS: 1.0000 | ORAL_TABLET | Freq: Every day | ORAL | 12 refills | Status: AC

## 2017-12-17 NOTE — Progress Notes (Addendum)
Patient ID: Kristen Stewart, female   DOB: Nov 07, 1985, 32 y.o.   MRN: [id number] History of Present Illness: Kristen Stewart is a 32 year old black female,single, who resides at [facility], she has CP, and spina bifida, and uses a wheel chair.She has had oral sex, and wants STD testing. She had normal pap with negative HPV 07/27/16. PCP is Dr Eden EmmsAriza.    Current Medications, Allergies, Past Medical History, Past Surgical History, Family History and Social History were reviewed in [facility] electronic medical record.     Review of Systems: Patient denies any headaches, hearing loss, fatigue, blurred vision, shortness of breath, chest pain, abdominal pain, problems with bowel movements, urination(does self cath), or intercourse(not having sex). No joint pain or mood swings.No sore throat. She says she wants to be in relationship, and has been using [employer].    Physical Exam:BP 115/76 (BP Location: Left Arm, Patient Position: Sitting, Cuff Size: Normal)  General:  Pleasant, well nourished, no acute distress Skin:  Warm and dry Neck:  Midline trachea, normal thyroid, good ROM, no lymphadenopathy,oral swab for GC/CHL obtained, no lesions seen Lungs; Clear to auscultation bilaterally Breast:  No dominant palpable mass, retraction, or nipple discharge Cardiovascular: Regular rate and rhythm Abdomen:  Soft, non tender, no hepatosplenomegaly Pelvic:  External genitalia is normal in appearance, no lesions.  The vagina is normal in appearance. Urethra has no lesions or masses. The cervix is not visualized.  Uterus is felt to be normal size, shape, and contour.  No adnexal masses or tenderness noted.Bladder is non tender, no masses felt. Extremities/musculoskeletal:  No swelling or varicosities noted, no cyanosis,has paralysis of legs Psych:  No mood changes, alert and cooperative,seems happy PHQ 9 score 15, is on zoloft and has xanax, no plans of suicide  Discussed the dangers of the  Internet, and not to have casual sex. She ask about getting pregnant, would need US to assess uterus and ovaries, and still not sure about it.   Impression: 1. Encounter for well woman exam with routine gynecological exam   2. Screening examination for STD (sexually transmitted disease)   3. Encounter for menstrual regulation   4. Depression, unspecified depression type       Plan: GC/CHL sent on throat Check HIV and RPR Physical in 1 year Pap in 2021

## 2017-12-18 LAB — RPR: RPR: NONREACTIVE

## 2017-12-18 LAB — HIV ANTIBODY (ROUTINE TESTING W REFLEX): HIV SCREEN 4TH GENERATION: NONREACTIVE

## 2017-12-19 LAB — GC/CHLAMYDIA PROBE AMP
CHLAMYDIA, DNA PROBE: NEGATIVE
NEISSERIA GONORRHOEAE BY PCR: NEGATIVE

## 2018-02-11 ENCOUNTER — Ambulatory Visit (INDEPENDENT_AMBULATORY_CARE_PROVIDER_SITE_OTHER): Payer: Medicare Other | Admitting: Adult Health

## 2018-02-11 ENCOUNTER — Encounter: Payer: Self-pay | Admitting: Adult Health

## 2018-02-11 VITALS — BP 121/79 | HR 117

## 2018-02-11 DIAGNOSIS — F419 Anxiety disorder, unspecified: Secondary | ICD-10-CM | POA: Diagnosis not present

## 2018-02-11 DIAGNOSIS — F329 Major depressive disorder, single episode, unspecified: Secondary | ICD-10-CM | POA: Diagnosis not present

## 2018-02-11 MED ORDER — BUSPIRONE HCL 7.5 MG PO TABS: 7.5000 mg | ORAL_TABLET | Freq: Three times a day (TID) | ORAL | 3 refills | Status: DC

## 2018-02-11 NOTE — Progress Notes (Signed)
  Subjective:     Patient ID: Kristen Stewart, female   DOB: 1985/07/02, 32 y.o.   MRN: [id number]  HPI Kristen Stewart is a 32 year old black female, with CP and spina bifida, who [facility], in complaining of her "nervies".She says her anxiety gets worse before appointments. She is in wheelchair.   Review of Systems +anxious,  Reviewed past medical,surgical, social and family history. Reviewed medications and allergies.     Objective:   Physical Exam BP 121/79 (BP Location: Left Arm, Patient Position: Sitting, Cuff Size: Normal)   Pulse (!) 117   PHQ 9 score 9.Denies being suicidal, will add buspar to her the zoloft she is already taking.    Assessment:     1. Anxiety and depression       Plan:     Meds ordered this encounter  Medications  . busPIRone (BUSPAR) 7.5 MG tablet    Sig: Take 1 tablet (7.5 mg total) by mouth 3 (three) times daily.    Dispense:  90 tablet    Refill:  3    Order Specific Question:   Supervising Provider    Answer:   Despina HiddenEURE, LUTHER H [[id number]]  F/U in 6 weeks

## 2018-03-25 ENCOUNTER — Encounter: Payer: Self-pay | Admitting: Adult Health

## 2018-03-25 ENCOUNTER — Ambulatory Visit (INDEPENDENT_AMBULATORY_CARE_PROVIDER_SITE_OTHER): Payer: Medicare Other | Admitting: Adult Health

## 2018-03-25 VITALS — BP 117/76 | HR 105

## 2018-03-25 DIAGNOSIS — F329 Major depressive disorder, single episode, unspecified: Secondary | ICD-10-CM

## 2018-03-25 DIAGNOSIS — F419 Anxiety disorder, unspecified: Secondary | ICD-10-CM | POA: Diagnosis not present

## 2018-03-25 MED ORDER — HYDROXYZINE HCL 10 MG PO TABS: 10.0000 mg | ORAL_TABLET | Freq: Three times a day (TID) | ORAL | 1 refills | Status: DC | PRN

## 2018-03-25 NOTE — Progress Notes (Signed)
  Subjective:     Patient ID: Kristen Stewart, female   DOB: 09-18-1985, 32 y.o.   MRN: [id number]  HPI Kristen Stewart is a 32 year old black female, with CP and spina bifida, who resides at [facility], back in follow up of being prescribed buspar for her anxiety and she stopped it after 1, she says it made her have anxiety attack. She requests xanax, I told her to talk to her PCP about that.She is on zoloft already for depression.  PCP is Dr Eden Emms.   Review of Systems Still anxious Stopped buspar after 1, had anxiety attack Reviewed past medical,surgical, social and family history. Reviewed medications and allergies.     Objective:   Physical Exam BP 117/76 (BP Location: Left Arm, Patient Position: Sitting, Cuff Size: Normal)   Pulse (!) 105   Talk only. Skin warm and dry, is in wheelchair.  PHQ 9 score 9, is on zoloft, denies being suicidal. Will try vistaril 10 mg 1 every 8 hours prn anxiety.I wants xanax discuss with PCP.    Assessment:     1. Anxiety and depression       Plan:     Meds ordered this encounter  Medications  . hydrOXYzine (ATARAX/VISTARIL) 10 MG tablet    Sig: Take 1 tablet (10 mg total) by mouth 3 (three) times daily as needed.    Dispense:  30 tablet    Refill:  1    Order Specific Question:   Supervising Provider    Answer:   Despina Hidden, LUTHER H [[id number]]     F/U in 6 weeks

## 2018-05-06 ENCOUNTER — Other Ambulatory Visit: Payer: Self-pay

## 2018-05-06 ENCOUNTER — Encounter: Payer: Self-pay | Admitting: Adult Health

## 2018-05-06 ENCOUNTER — Ambulatory Visit (INDEPENDENT_AMBULATORY_CARE_PROVIDER_SITE_OTHER): Payer: Medicare Other | Admitting: Adult Health

## 2018-05-06 VITALS — BP 125/85 | HR 107 | Ht 59.0 in | Wt 129.0 lb

## 2018-05-06 DIAGNOSIS — F419 Anxiety disorder, unspecified: Secondary | ICD-10-CM

## 2018-05-06 DIAGNOSIS — F329 Major depressive disorder, single episode, unspecified: Secondary | ICD-10-CM | POA: Diagnosis not present

## 2018-05-06 MED ORDER — BUSPIRONE HCL 5 MG PO TABS: 5.0000 mg | ORAL_TABLET | Freq: Two times a day (BID) | ORAL | 6 refills | Status: AC

## 2018-05-06 NOTE — Progress Notes (Addendum)
  Subjective:     Patient ID: Kristen Stewart, female   DOB: 05/16/1986, 32 y.o.   MRN: [id number]  HPI Kristen Stewart is a 32 year old black female, with CP and spina bifida in wheelchair in to discuss anxiety and depression.She resides [facility].  PCP is Dr Eden EmmsAriza.   Review of Systems Feels better Has had yellowish discharge at times, no odor, or itching Reviewed past medical,surgical, social and family history. Reviewed medications and allergies.     Objective:   Physical Exam BP 125/85 (BP Location: Left Arm, Patient Position: Sitting, Cuff Size: Normal)   Pulse (!) 107   Ht 4\' 11"  (1.499 m)   Wt 129 lb (58.5 kg)   BMI 26.05 kg/m   PHQ 9 score is 8, 1 point down from October, she denies being suicidal or homicidal. Talk only: she is more up beat and seems happier.  Will refill Buspar,continue it  and continue Zoloft, has xanax from PCP to use prn. Will get back in about a month for a physical.    Assessment:     1. Anxiety and depression       Plan:     Meds ordered this encounter  Medications  . busPIRone (BUSPAR) 5 MG tablet    Sig: Take 1 tablet (5 mg total) by mouth 2 (two) times daily.    Dispense:  50 tablet    Refill:  6    Order Specific Question:   Supervising Provider    Answer:   Duane LopeEURE, LUTHER H [[id number]]  Continue Zoloft  Return in 4 weeks for physical

## 2018-06-05 ENCOUNTER — Other Ambulatory Visit: Payer: Medicare Other | Admitting: Adult Health

## 2018-06-10 ENCOUNTER — Other Ambulatory Visit (HOSPITAL_COMMUNITY): Payer: Self-pay | Admitting: Obstetrics & Gynecology

## 2018-06-10 ENCOUNTER — Other Ambulatory Visit (HOSPITAL_COMMUNITY): Payer: Self-pay | Admitting: Neurology

## 2018-06-10 ENCOUNTER — Other Ambulatory Visit: Payer: Self-pay | Admitting: Obstetrics & Gynecology

## 2018-06-10 ENCOUNTER — Other Ambulatory Visit: Payer: Self-pay | Admitting: Neurology

## 2018-06-10 DIAGNOSIS — R519 Headache, unspecified: Secondary | ICD-10-CM

## 2018-06-10 DIAGNOSIS — R51 Headache: Principal | ICD-10-CM

## 2018-06-14 ENCOUNTER — Encounter (HOSPITAL_COMMUNITY): Payer: Self-pay

## 2018-06-14 ENCOUNTER — Ambulatory Visit (HOSPITAL_COMMUNITY)
Admission: RE | Admit: 2018-06-14 | Discharge: 2018-06-14 | Disposition: A | Discharge status: Home / Self Care | Payer: Medicare Other | Source: Ambulatory Visit | Attending: Neurology | Admitting: Neurology

## 2018-06-14 DIAGNOSIS — R519 Headache, unspecified: Secondary | ICD-10-CM

## 2018-06-14 DIAGNOSIS — R51 Headache: Secondary | ICD-10-CM | POA: Insufficient documentation

## 2018-06-27 ENCOUNTER — Other Ambulatory Visit: Payer: Medicare Other | Admitting: Adult Health

## 2018-07-23 IMAGING — CT CT HEAD W/O CM
3 series · 16 of 47 positions shown, 19 images · non-contrast
Comparison: 07/28/2016

CLINICAL DATA: Headaches for 3 weeks, sleeping a lot, no known
injury, history of VP shunt placed in October 2015, non intractable
headache of unspecified chronicity pattern

EXAM:
CT HEAD WITHOUT CONTRAST
TECHNIQUE: Contiguous axial images were obtained from the base of the skull
through the vertex without intravenous contrast. Sagittal and
coronal MPR images reconstructed from axial data set.

[Series 2: head wo · axial · 0.40mm/px · z∈[-3,+132]mm · 10 of 33 slices shown, 13 images]
[im 3/33  brain]
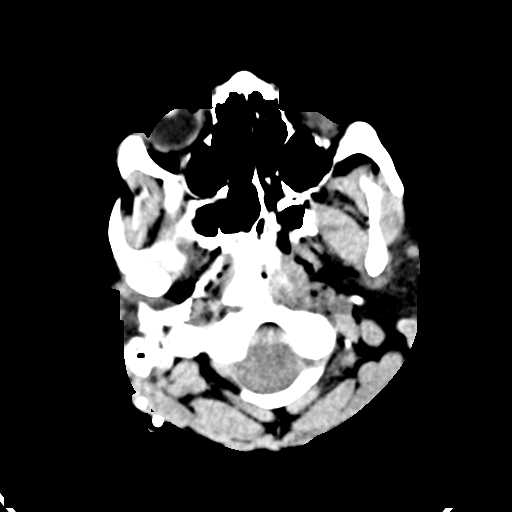
[im 3/33  bone]
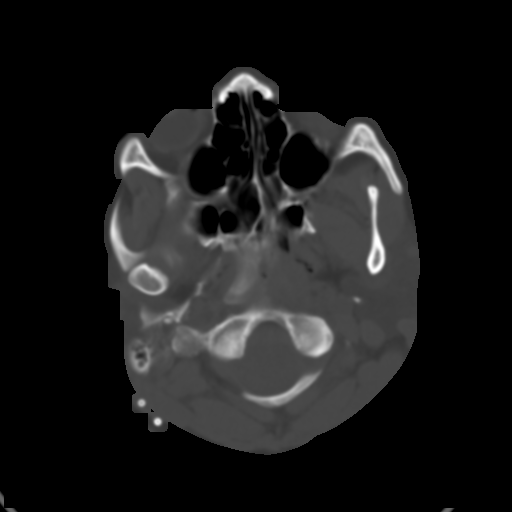
[im 6/33  brain]
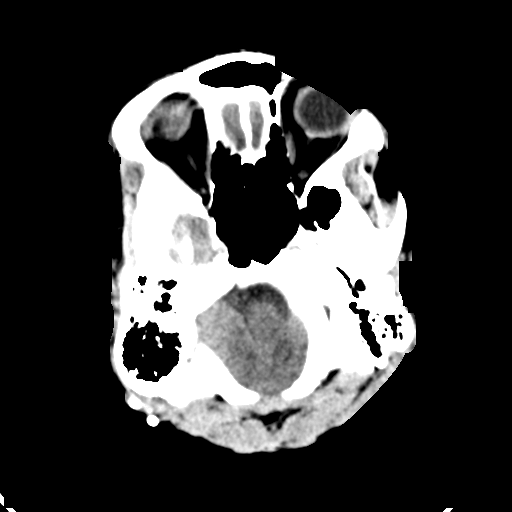
[im 9/33  brain]
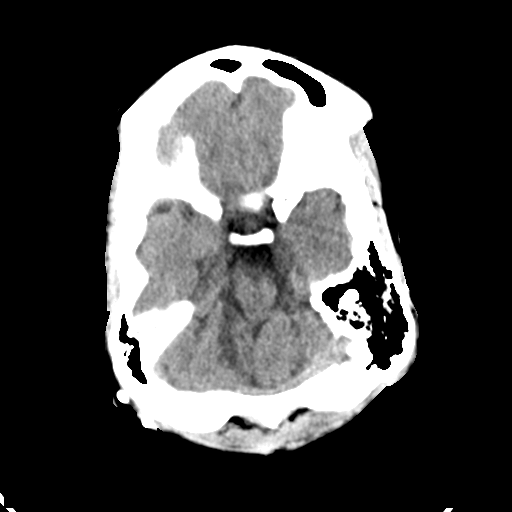
[im 12/33  brain]
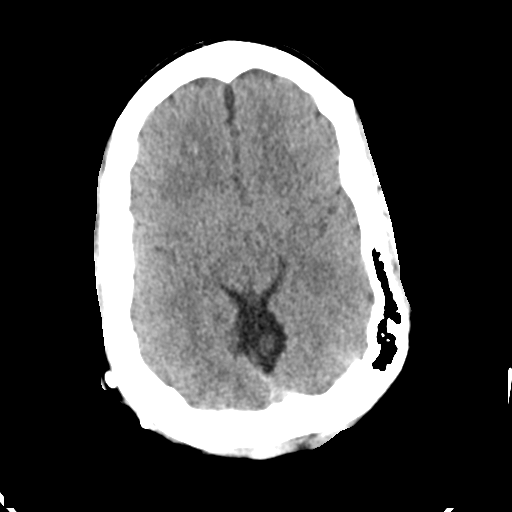
[im 15/33  brain]
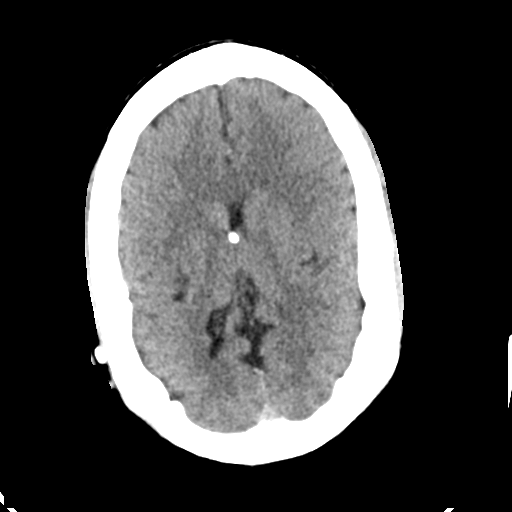
[im 15/33  bone]
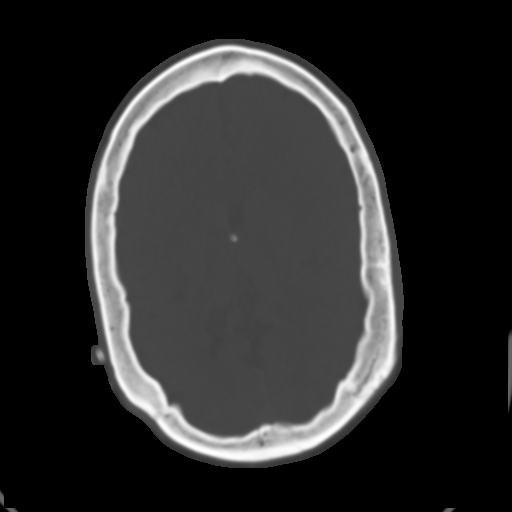
[im 18/33  brain]
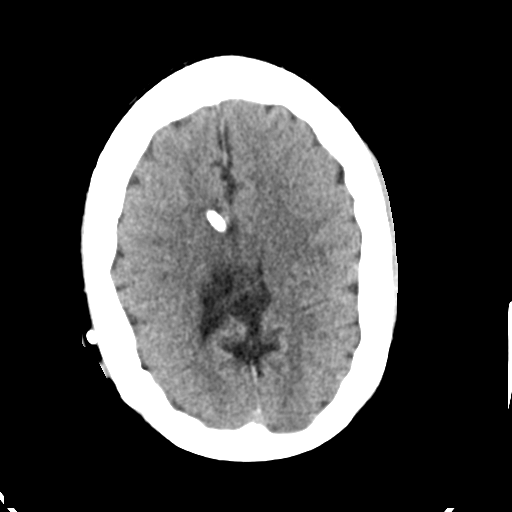
[im 21/33  brain]
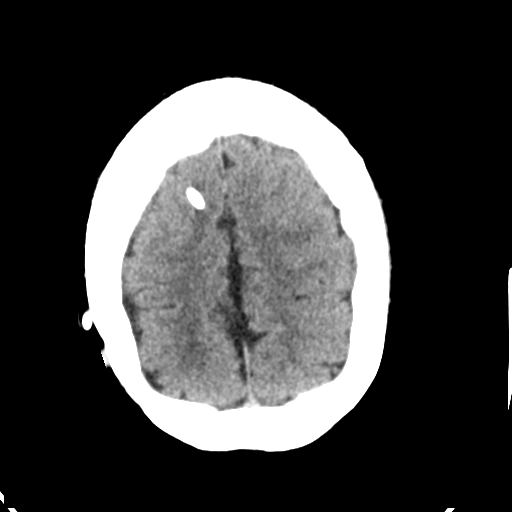
[im 25/33  brain]
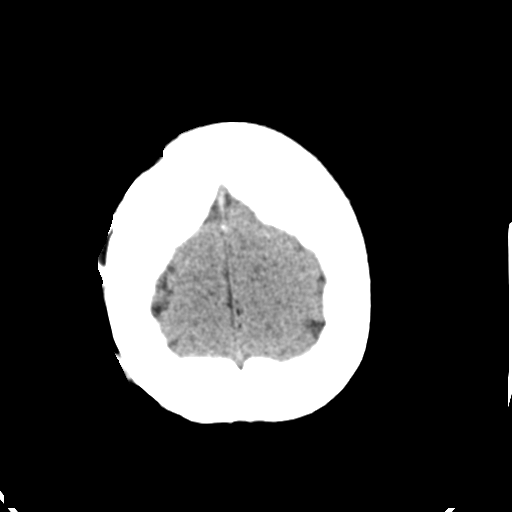
[im 27/33  brain]
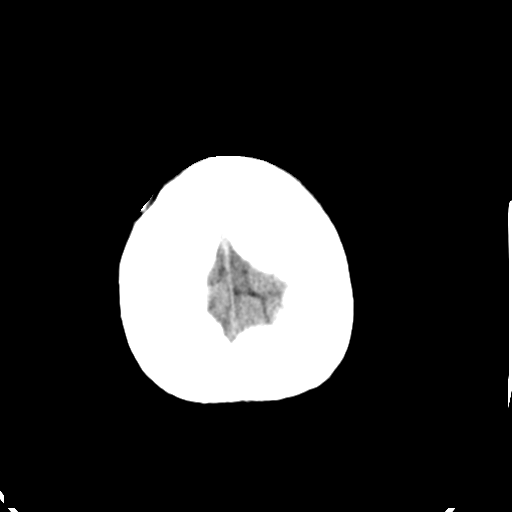
[im 27/33  bone]
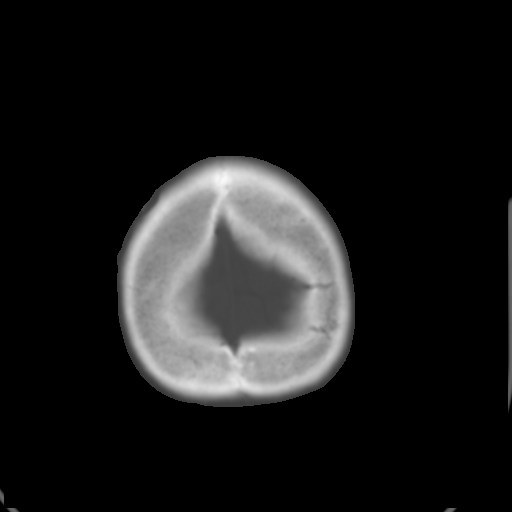
[im 30/33  brain]
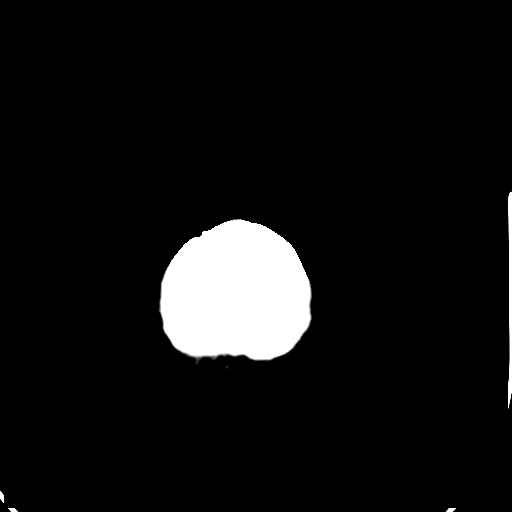

[Series 4: coronal soft tissue · coronal · 0.32mm/px · 3 of 61 slices shown]
[im 21/61  brain]
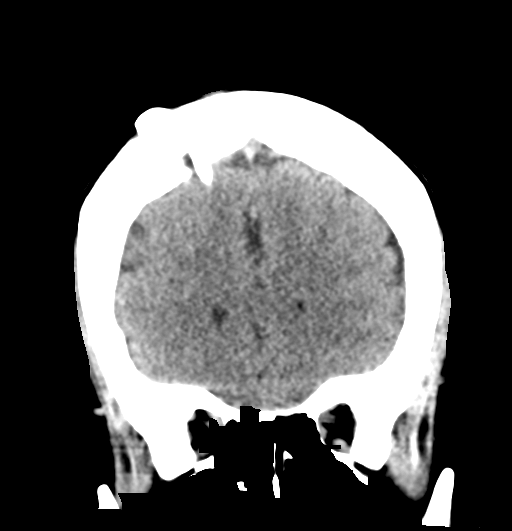
[im 27/61  brain]
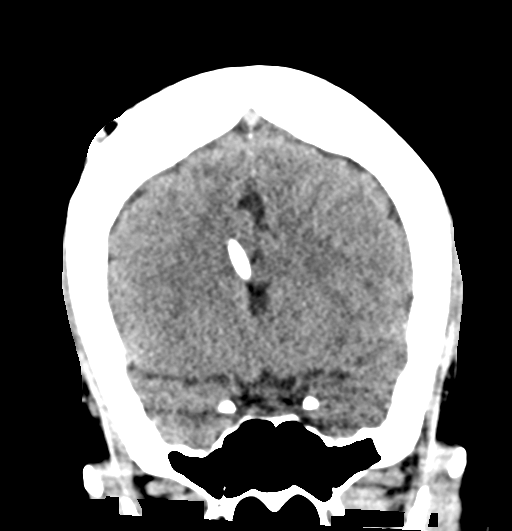
[im 34/61  brain]
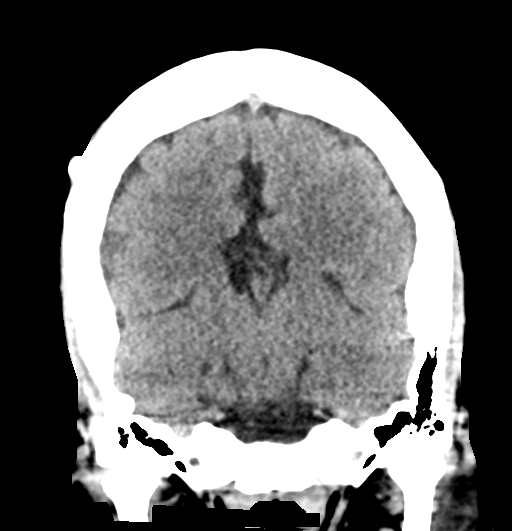

[Series 5: sagittal soft tissue · sagittal · 0.34mm/px · 3 of 49 slices shown]
[im 17/49  brain]
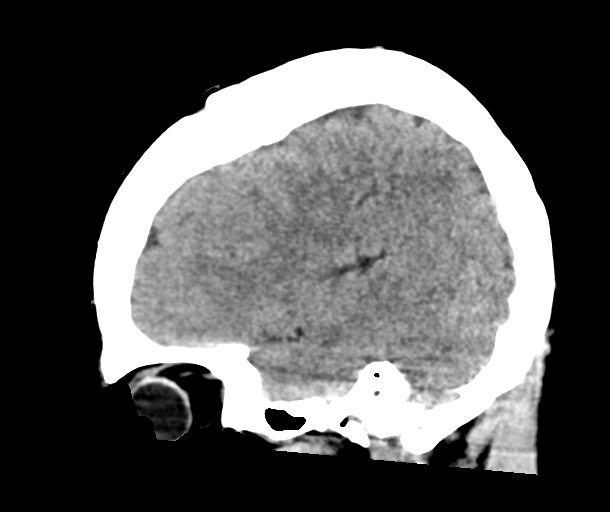
[im 25/49  brain]
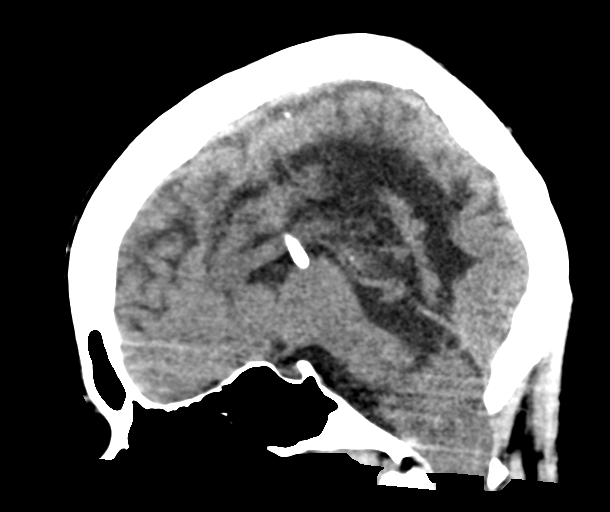
[im 33/49  brain]
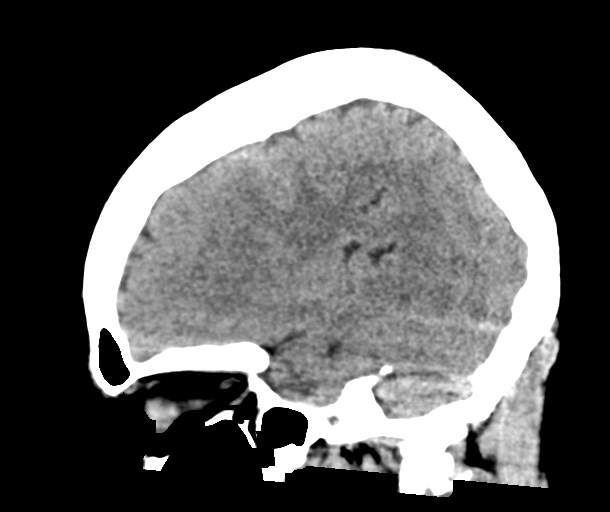

[16 of 47 positions shown; findings below may reference images not displayed]

FINDINGS: Brain: VP shunt via RIGHT frontal approach with tip at foramina of
[place]. Abnormal appearance of the posterior aspects of the lateral
ventricles compatible with dysgenesis of the corpus callosum.
Question vermian dysgenesis versus displacement. Descent of the
cerebellum through foramen magnum. Hypoplastic fourth ventricle.
Findings are consistent with Chiari 2 malformation. Lateral
ventricles appear decompressed. No midline shift. No intracranial
hemorrhage, mass lesion, evidence of acute infarction or extra-axial
fluid collection.

Vascular: Unremarkable

Skull: Prior posterior RIGHT parietal burr hole. Calvarium otherwise
unremarkable.

Sinuses/Orbits: Clear

Other: N/A
IMPRESSION: Chiari 2 malformation again seen with stable appearance of
intraventricular shunt.

No acute intracranial abnormalities.

## 2023-10-04 ENCOUNTER — Encounter (HOSPITAL_BASED_OUTPATIENT_CLINIC_OR_DEPARTMENT_OTHER): Attending: General Surgery | Admitting: Internal Medicine

## 2023-10-04 DIAGNOSIS — Q053 Sacral spina bifida with hydrocephalus: Secondary | ICD-10-CM | POA: Insufficient documentation

## 2023-10-04 DIAGNOSIS — L97528 Non-pressure chronic ulcer of other part of left foot with other specified severity: Secondary | ICD-10-CM | POA: Diagnosis not present

## 2023-10-04 DIAGNOSIS — E11621 Type 2 diabetes mellitus with foot ulcer: Secondary | ICD-10-CM | POA: Insufficient documentation

## 2023-10-04 DIAGNOSIS — B965 Pseudomonas (aeruginosa) (mallei) (pseudomallei) as the cause of diseases classified elsewhere: Secondary | ICD-10-CM | POA: Diagnosis not present

## 2023-10-07 LAB — AEROBIC CULTURE W GRAM STAIN (SUPERFICIAL SPECIMEN): Gram Stain: NONE SEEN

## 2023-10-12 ENCOUNTER — Encounter (HOSPITAL_BASED_OUTPATIENT_CLINIC_OR_DEPARTMENT_OTHER): Admitting: Internal Medicine

## 2023-10-12 DIAGNOSIS — E11621 Type 2 diabetes mellitus with foot ulcer: Secondary | ICD-10-CM | POA: Diagnosis not present

## 2023-10-23 ENCOUNTER — Encounter (HOSPITAL_BASED_OUTPATIENT_CLINIC_OR_DEPARTMENT_OTHER): Admitting: General Surgery

## 2023-10-23 DIAGNOSIS — E11621 Type 2 diabetes mellitus with foot ulcer: Secondary | ICD-10-CM | POA: Diagnosis not present

## 2023-10-31 ENCOUNTER — Encounter (HOSPITAL_BASED_OUTPATIENT_CLINIC_OR_DEPARTMENT_OTHER): Attending: General Surgery | Admitting: General Surgery

## 2023-10-31 DIAGNOSIS — L97528 Non-pressure chronic ulcer of other part of left foot with other specified severity: Secondary | ICD-10-CM | POA: Insufficient documentation

## 2023-10-31 DIAGNOSIS — E11621 Type 2 diabetes mellitus with foot ulcer: Secondary | ICD-10-CM | POA: Insufficient documentation

## 2023-10-31 DIAGNOSIS — Q053 Sacral spina bifida with hydrocephalus: Secondary | ICD-10-CM | POA: Insufficient documentation

## 2023-11-19 ENCOUNTER — Encounter (HOSPITAL_BASED_OUTPATIENT_CLINIC_OR_DEPARTMENT_OTHER): Admitting: General Surgery

## 2023-11-19 DIAGNOSIS — E11621 Type 2 diabetes mellitus with foot ulcer: Secondary | ICD-10-CM | POA: Diagnosis not present

## 2023-12-03 ENCOUNTER — Ambulatory Visit (HOSPITAL_BASED_OUTPATIENT_CLINIC_OR_DEPARTMENT_OTHER): Admitting: General Surgery

## 2023-12-04 ENCOUNTER — Encounter (HOSPITAL_BASED_OUTPATIENT_CLINIC_OR_DEPARTMENT_OTHER): Attending: General Surgery | Admitting: General Surgery

## 2023-12-04 DIAGNOSIS — L97528 Non-pressure chronic ulcer of other part of left foot with other specified severity: Secondary | ICD-10-CM | POA: Diagnosis not present

## 2023-12-04 DIAGNOSIS — Q053 Sacral spina bifida with hydrocephalus: Secondary | ICD-10-CM | POA: Diagnosis not present

## 2023-12-04 DIAGNOSIS — E11621 Type 2 diabetes mellitus with foot ulcer: Secondary | ICD-10-CM | POA: Insufficient documentation

## 2023-12-18 ENCOUNTER — Encounter (HOSPITAL_BASED_OUTPATIENT_CLINIC_OR_DEPARTMENT_OTHER): Admitting: General Surgery

## 2023-12-18 DIAGNOSIS — E11621 Type 2 diabetes mellitus with foot ulcer: Secondary | ICD-10-CM | POA: Diagnosis not present

## 2024-01-01 ENCOUNTER — Encounter (HOSPITAL_BASED_OUTPATIENT_CLINIC_OR_DEPARTMENT_OTHER): Attending: General Surgery | Admitting: General Surgery

## 2024-01-01 DIAGNOSIS — E11621 Type 2 diabetes mellitus with foot ulcer: Secondary | ICD-10-CM | POA: Diagnosis present

## 2024-01-01 DIAGNOSIS — L97528 Non-pressure chronic ulcer of other part of left foot with other specified severity: Secondary | ICD-10-CM | POA: Insufficient documentation

## 2024-01-01 DIAGNOSIS — Q053 Sacral spina bifida with hydrocephalus: Secondary | ICD-10-CM | POA: Diagnosis not present

## 2024-03-26 ENCOUNTER — Other Ambulatory Visit: Payer: Self-pay

## 2024-03-26 ENCOUNTER — Emergency Department (HOSPITAL_COMMUNITY)
Admission: EM | Admit: 2024-03-26 | Discharge: 2024-03-26 | Disposition: A | Discharge status: Home / Self Care | Source: Skilled Nursing Facility | Attending: Emergency Medicine | Admitting: Emergency Medicine

## 2024-03-26 ENCOUNTER — Emergency Department (HOSPITAL_COMMUNITY)

## 2024-03-26 ENCOUNTER — Encounter (HOSPITAL_COMMUNITY): Payer: Self-pay

## 2024-03-26 DIAGNOSIS — J9811 Atelectasis: Secondary | ICD-10-CM | POA: Diagnosis not present

## 2024-03-26 DIAGNOSIS — Z91040 Latex allergy status: Secondary | ICD-10-CM | POA: Insufficient documentation

## 2024-03-26 DIAGNOSIS — R519 Headache, unspecified: Secondary | ICD-10-CM | POA: Diagnosis present

## 2024-03-26 DIAGNOSIS — Z79899 Other long term (current) drug therapy: Secondary | ICD-10-CM | POA: Insufficient documentation

## 2024-03-26 DIAGNOSIS — Y732 Prosthetic and other implants, materials and accessory gastroenterology and urology devices associated with adverse incidents: Secondary | ICD-10-CM | POA: Insufficient documentation

## 2024-03-26 DIAGNOSIS — Z743 Need for continuous supervision: Secondary | ICD-10-CM | POA: Diagnosis not present

## 2024-03-26 DIAGNOSIS — D72829 Elevated white blood cell count, unspecified: Secondary | ICD-10-CM | POA: Insufficient documentation

## 2024-03-26 DIAGNOSIS — Z982 Presence of cerebrospinal fluid drainage device: Secondary | ICD-10-CM | POA: Diagnosis not present

## 2024-03-26 DIAGNOSIS — E876 Hypokalemia: Secondary | ICD-10-CM | POA: Insufficient documentation

## 2024-03-26 DIAGNOSIS — R000 Tachycardia, unspecified: Secondary | ICD-10-CM | POA: Insufficient documentation

## 2024-03-26 DIAGNOSIS — N390 Urinary tract infection, site not specified: Secondary | ICD-10-CM | POA: Diagnosis not present

## 2024-03-26 DIAGNOSIS — T85511A Breakdown (mechanical) of esophageal anti-reflux device, initial encounter: Secondary | ICD-10-CM | POA: Diagnosis not present

## 2024-03-26 DIAGNOSIS — G4489 Other headache syndrome: Secondary | ICD-10-CM | POA: Diagnosis not present

## 2024-03-26 DIAGNOSIS — R031 Nonspecific low blood-pressure reading: Secondary | ICD-10-CM | POA: Insufficient documentation

## 2024-03-26 LAB — CBC WITH DIFFERENTIAL/PLATELET
Abs Immature Granulocytes: 0.09 K/uL — ABNORMAL HIGH (ref 0.00–0.07)
Basophils Absolute: 0.1 K/uL (ref 0.0–0.1)
Basophils Relative: 0 %
Eosinophils Absolute: 0.1 K/uL (ref 0.0–0.5)
Eosinophils Relative: 0 %
HCT: 37.9 % (ref 36.0–46.0)
Hemoglobin: 11.7 g/dL — ABNORMAL LOW (ref 12.0–15.0)
Immature Granulocytes: 1 %
Lymphocytes Relative: 10 %
Lymphs Abs: 1.9 K/uL (ref 0.7–4.0)
MCH: 24 pg — ABNORMAL LOW (ref 26.0–34.0)
MCHC: 30.9 g/dL (ref 30.0–36.0)
MCV: 77.7 fL — ABNORMAL LOW (ref 80.0–100.0)
Monocytes Absolute: 1 K/uL (ref 0.1–1.0)
Monocytes Relative: 5 %
Neutro Abs: 14.9 K/uL — ABNORMAL HIGH (ref 1.7–7.7)
Neutrophils Relative %: 84 %
Platelets: 368 K/uL (ref 150–400)
RBC: 4.88 MIL/uL (ref 3.87–5.11)
RDW: 15.1 % (ref 11.5–15.5)
WBC: 18 K/uL — ABNORMAL HIGH (ref 4.0–10.5)
nRBC: 0 % (ref 0.0–0.2)

## 2024-03-26 LAB — URINALYSIS, ROUTINE W REFLEX MICROSCOPIC
Bilirubin Urine: NEGATIVE
Bilirubin Urine: NEGATIVE
Glucose, UA: >=500 mg/dL — AB
Glucose, UA: NEGATIVE mg/dL
Hgb urine dipstick: NEGATIVE
Ketones, ur: NEGATIVE mg/dL
Ketones, ur: NEGATIVE mg/dL
Nitrite: POSITIVE — AB
Nitrite: POSITIVE — AB
Protein, ur: 100 mg/dL — AB
Protein, ur: 100 mg/dL — AB
Specific Gravity, Urine: 1.017 (ref 1.005–1.030)
Specific Gravity, Urine: 1.015 (ref 1.005–1.030)
WBC, UA: >50 WBC/hpf (ref 0–5)
pH: 6 (ref 5.0–8.0)
pH: 6.5 (ref 5.0–8.0)

## 2024-03-26 LAB — BASIC METABOLIC PANEL WITH GFR
Anion gap: 14 (ref 5–15)
BUN: 9 mg/dL (ref 6–20)
CO2: 27 mmol/L (ref 22–32)
Calcium: 9.3 mg/dL (ref 8.9–10.3)
Chloride: 95 mmol/L — ABNORMAL LOW (ref 98–111)
Creatinine, Ser: 0.52 mg/dL (ref 0.44–1.00)
GFR, Estimated: >60 mL/min (ref >60)
Glucose, Bld: 168 mg/dL — ABNORMAL HIGH (ref 70–99)
Potassium: 3.1 mmol/L — ABNORMAL LOW (ref 3.5–5.1)
Sodium: 136 mmol/L (ref 135–145)

## 2024-03-26 LAB — URINALYSIS, MICROSCOPIC (REFLEX): WBC, UA: >50 WBC/hpf (ref 0–5)

## 2024-03-26 LAB — HCG, SERUM, QUALITATIVE: Preg, Serum: NEGATIVE

## 2024-03-26 LAB — LACTIC ACID, PLASMA: Lactic Acid, Venous: 2.8 mmol/L (ref 0.5–1.9)

## 2024-03-26 MED ORDER — ACETAMINOPHEN 500 MG PO TABS
1000.0000 mg | ORAL_TABLET | Freq: Once | ORAL | Status: AC
  Administered 2024-03-26: 1000 mg via ORAL
  Filled 2024-03-26: qty 2

## 2024-03-26 MED ORDER — POTASSIUM CHLORIDE CRYS ER 20 MEQ PO TBCR
40.0000 meq | EXTENDED_RELEASE_TABLET | Freq: Once | ORAL | Status: AC
  Administered 2024-03-26: 40 meq via ORAL
  Filled 2024-03-26: qty 2

## 2024-03-26 MED ORDER — SODIUM CHLORIDE 0.9 % IV SOLN
1.0000 g | Freq: Once | INTRAVENOUS | Status: AC
  Administered 2024-03-26: 1 g via INTRAVENOUS
  Filled 2024-03-26: qty 10

## 2024-03-26 MED ORDER — LACTATED RINGERS IV BOLUS
1000.0000 mL | Freq: Once | INTRAVENOUS | Status: AC
  Administered 2024-03-26: 1000 mL via INTRAVENOUS

## 2024-03-26 NOTE — ED Triage Notes (Signed)
 Pt BIB [facility] from [facility] for headaches for 3 days. Pt alert and oriented. Kristen Stewart is concerned for VP shunt in Pts head is blocked  Vitals WNL  CBG 281 w/ breakfast w/o insulin

## 2024-03-26 NOTE — ED Provider Notes (Signed)
 ------------------------------------------------------------------------------- Attestation signed by Marolyn Monte, MD at 03/27/2024 11:12 AM I saw and evaluated the patient. Discussed with Dr. Margert stone and agree with their findings and plan and documented in the their note.   Diagnostic Exams: I have personally reviewed the images and agree with the findings as documented   CTH at OSH with increased ventricle size c/f shunt malfunction. Febrile, but had UTI at OSH. CSF drawn off by NSU, can rule out meningitis but clinically she does nto seem to have meningitis. Admit to NSU to follow up CSF studies and shunt function.   Marolyn Monte, MD  -------------------------------------------------------------------------------  Emergency Department Provider Note  HPI, ROS, Medical Decision Making  Chief Complaint:  Headache   Kristen Stewart is a 38 y.o. female patient with a PMHx of   History of Present Illness The patient presents for evaluation of a VP shunt  She reports that her suprapubic catheter was replaced at the facility, although there is no official documentation to confirm this. She has undergone a CT scan of the head and x-rays for a shunt series. An MRI was recommended as a follow-up, but she was instead referred to our facility. She reports feeling drowsy but is not experiencing any pain or weakness. She has been diagnosed with a urinary tract infection (UTI) associated with the suprapubic catheter. She is currently on her second bag of Lactated Ringer's solution. Blood cultures have also been performed. She has been administered vancomycin, Rocephin, potassium, and Tylenol.    ROS Negative except as per HPI  MDM  I have reviewed the nursing documentation for past medical history, family history, and social history and agree.  I have reviewed the patient's vital signs.  She is tachycardic. Upon initial evaluation physical exam reveals patient no acute distress, she  is alert, oriented, has a normal neuroexam.    DDX considered included (but not limited to): Urinary tract infection, shunt malfunction, intracranial hemorrhage.   History, physical examination, and objective data at this time most consistent with shunt malfunction and urinary tract infection.   Plan: Will obtain x-ray shunt series Neurologic consult  Testing Results: Lab results: Leukocytosis Imaging results: No obvious shunt abnormality on shunt series    Interventions: These medications and interventions were provided for the patient while in the ED. Medications  acetaminophen (TYLENOL) suppository 120 mg (120 mg rectal Given 03/26/24 2133)  lactated ringer's (bolus) bolus 1,000 mL (0 mL intravenous Stopped 03/26/24 2233)    ED Course: Patient with a normal neurologic exam and is alert and oriented. Shunt series performed per NSG request; normal. NSG evaluated and tapped the shunt, studies were sent off. Patient did spike a fever, had already received abx and gotten cultured at OSH, tylenol and fluids given. Patient will be admitted to neurosurgery service.   Post-ED Care: Due to the patients current presenting symptoms, physical exam findings, and the workup stated above, it is thought that the etiology of the patients current presentation is:  1. VP (ventriculoperitoneal) shunt status   2. Acute cystitis without hematuria     ADMIT: Patient is thought to require admission for shunt management, UTI. Patient will be admitted to neurosurgery service. Please see in patient provider note for additional treatment plan details.     The plan for this patient was discussed with my attending physician, who voiced agreement and who oversaw evaluation and treatment of this patient.    Note: Chief executive officer was used in the creation of this note. Grammatical  errors may be present.    Clinical Complexity Patient's presentation is most consistent with acute  presentation with potential threat to life or bodily function.   Medical History[1] Surgical History[2] Family History[3] Social History   Socioeconomic History  . Marital status: Single    Spouse name: Not on file  . Number of children: Not on file  . Years of education: Not on file  . Highest education level: Not on file  Occupational History  . Not on file  Tobacco Use  . Smoking status: Never  . Smokeless tobacco: Never  Substance and Sexual Activity  . Alcohol use: No  . Drug use: Yes    Types: Oxycodone, Hydrocodone  . Sexual activity: Not on file    Comment: OCP cont.  Other Topics Concern  . Not on file  Social History Narrative  . Not on file   Social Drivers of Health   Food Insecurity: Not on file  Transportation Needs: Not on file  Safety: Not on file  Living Situation: Not on file     ED Course as of 03/27/24 0002  Wed Mar 26, 2024  2310 S- admit RTM NSU Hx VP shunt, p/w shunt malfunction and UTI  S/p rocephin at OSH  NSU drained CSF  [GG]    ED Course User Index [GG] Laverda Karie Rimes, MD    1. VP (ventriculoperitoneal) shunt status   2. Acute cystitis without hematuria     ED Disposition     ED Disposition  Admit   Condition  --   Comment  Primary Provider Group: Yes [1]  Provider Group:: [facility] Neurosurgery Admit Consult Team [[id number]]  Certification: I certify that inpatient services are medically necessary for this patient for a duration of greater than two midnights. See H&P and MD Progress Notes for additional information about the patient's course of treatment.          Physical Exam   Vitals:   03/26/24 2144 03/26/24 2214 03/26/24 2244 03/26/24 2313  BP: 110/67 96/63 108/68   BP Location:  Right arm Right arm   Patient Position:  Lying Lying   Pulse: (!) 141 (!) 133 (!) 129 (!) 126  Resp:  19 19   Temp:  (!) 102.2 F (39 C) (!) 101.3 F (38.5 C) (!) 100.8 F (38.2 C)  TempSrc:  Oral Oral Oral  SpO2: 95% 93% 93%  92%  Weight:      Height:        Physical Exam Vitals and nursing note reviewed.  Constitutional:      General: She is not in acute distress.    Appearance: Normal appearance. She is not ill-appearing.  HENT:     Head: Normocephalic and atraumatic.     Right Ear: External ear normal.     Left Ear: External ear normal.     Nose: Nose normal.     Mouth/Throat:     Mouth: Mucous membranes are moist.     Pharynx: Oropharynx is clear.  Eyes:     General: No scleral icterus.       Right eye: No discharge.        Left eye: No discharge.     Conjunctiva/sclera: Conjunctivae normal.     Pupils: Pupils are equal, round, and reactive to light.  Cardiovascular:     Rate and Rhythm: Regular rhythm. Tachycardia present.     Pulses: Normal pulses.     Heart sounds: Normal heart sounds. No murmur heard.  No friction rub. No gallop.  Pulmonary:     Effort: Pulmonary effort is normal. No respiratory distress.     Breath sounds: Normal breath sounds. No stridor. No wheezing, rhonchi or rales.  Abdominal:     General: Abdomen is flat. There is no distension.     Palpations: Abdomen is soft.     Tenderness: There is no abdominal tenderness. There is no right CVA tenderness, left CVA tenderness, guarding or rebound.  Musculoskeletal:        General: No deformity or signs of injury. Normal range of motion.     Cervical back: Normal range of motion and neck supple.     Right lower leg: No edema.     Left lower leg: No edema.  Skin:    General: Skin is warm and dry.     Capillary Refill: Capillary refill takes less than 2 seconds.  Neurological:     General: No focal deficit present.     Mental Status: She is alert and oriented to person, place, and time.  Psychiatric:        Mood and Affect: Mood normal.        Behavior: Behavior normal.     Procedure Note  Procedures    The patient was seen, evaluated, and treated in conjunction with the attending physician, who voiced agreement  in the care provided.  Note generated using Dragon voice dictation software and may contain dictation errors. Please contact me for any clarification or with any questions.   Electronically Signed By: Franky Munro, M.D. PGY-2, Emergency Medicine         [1] Past Medical History: Diagnosis Date  . Allergy   . Blood transfusion   . Constipation 06/01/2011  . GERD (gastroesophageal reflux disease)   . Headache   . Myelomeningocele    (CMD)   . Neurogenic bladder   . Personal history of urinary (tract) infection 07/03/2011  . Reflux 06/01/2011  . Spina bifida    (CMD)   . Stress incontinence, female   [2] Past Surgical History: Procedure Laterality Date  . BLADDER SURGERY     Procedure: BLADDER SURGERY  . CYSTOGRAM  03/14/2012   Procedure: CYSTOGRAM;  Surgeon: Marko Antionette Height, MD;  Location: [facility] MAIN OR;  Service: Urology;;  . PHYLLIS  03/14/2012   Procedure: CYSTOSCOPY;  Surgeon: Marko Antionette Height, MD;  Location: [facility] MAIN OR;  Service: Urology;  Laterality: N/A;  pouchoscopy  . CYSTOSCOPY  06/13/2012   Procedure: CYSTOSCOPY;  Surgeon: Marko Antionette Height, MD;  Location: [facility] MAIN OR;  Service: Urology;;  . ENDOSCOPY  03/14/2012   Procedure: ENDOSCOPY;  Surgeon: Marko Antionette Height, MD;  Location: [facility] MAIN OR;  Service: Urology;;  Endoscopy of Ileal pouch  . EXAMINATION UNDER ANESTHESIA  03/14/2012   Procedure: EXAM UNDER ANESTHESIA (EUA);  Surgeon: Marko Antionette Height, MD;  Location: [facility] MAIN OR;  Service: Urology;  Laterality: N/A;  . ILEOSTOMY     Procedure: ILEOSTOMY  . VENTRICULOPERITONEAL SHUNT     Procedure: VENTRICULOPERITONEAL SHUNT  . VENTRICULOPERITONEAL SHUNT Right 02/02/2016   Procedure: VP SHUNT (VENTRICULO-PERITONEAL) REVISION;  Surgeon: Norleen Dasie Blush, MD;  Location: [facility] MAIN OR;  Service: Neurosurgery;  Laterality: Right;  SABRA VESICOVAGINAL FISTULA CLOSURE  06/13/2012   Procedure: VESICOVAGINAL FISTULA REPAIR;  Surgeon: Marko Antionette Height, MD;   Location[facility] MAIN OR;  Service: Urology;  Laterality: N/A;  VESICOVAGINAL FISTULA CLOSURE  [3] Family History Problem Relation Name Age of Onset  . Depression  Paternal Aunt    . Depression Paternal Uncle    . Other Mother         good health  . Prostate cancer Father    . Other Sister         ok health  . Other Brother         good health  . Other Maternal Grandmother         good health  . Other Maternal Grandfather         good health  . Dementia Paternal Grandmother    . Unexplained death Paternal Grandfather    . Other Brother         good health  . Other Brother         good health

## 2024-03-26 NOTE — ED Notes (Signed)
 Date and time results received: 03/26/24 1825 (use smartphrase .now to insert current time)  Test: Lac Critical Value: 2.8  Name of Provider Notified: Sherran

## 2024-03-26 NOTE — ED Provider Notes (Addendum)
 [place] EMERGENCY DEPARTMENT AT [facility] Provider Note   CSN: [id number] Arrival date & time: 03/26/24  1243     Patient presents with: Headache   Kristen Stewart is a 38 y.o. female. To the ER complaining of headache to the top of her head for the past 3 days, denies fevers, denies neck pain, no nausea or vomiting.  She was sent in for evaluation of VP shunt to see if it was functioning.  She denies numbness tingling or weakness.  Denies vision changes, no exacerbating relieving factors.  She states she follows up with [facility] for shunt.  Last have a shunt work done in September 2017 by Dr. Norleen Blush.  He has history of spina bifida, congenital hydrocephalus, chronic dysarthria and paraplegia.  She is a resident at [facility].  Chronic indwelling Foley catheter.    Headache      Prior to Admission medications   Medication Sig Start Date End Date Taking? Authorizing Provider  ALPRAZolam (XANAX) 0.25 MG tablet Take 0.25 mg by mouth daily.    [provider]  busPIRone  (BUSPAR ) 5 MG tablet Take 1 tablet (5 mg total) by mouth 2 (two) times daily. 05/06/18   Signa Delon LABOR, NP  Cholecalciferol (VITAMIN D-3) 5000 units TABS Take 1 tablet by mouth daily.     [provider]  Cranberry 425 MG CAPS Take by mouth daily.    [provider]  fluticasone (FLONASE) 50 MCG/ACT nasal spray Place 2 sprays into both nostrils as needed.  09/13/17   [provider]  HYDROcodone-acetaminophen (NORCO/VICODIN) 5-325 MG tablet Take 1 tablet by mouth every 6 (six) hours as needed for moderate pain.    [provider]  ibuprofen  (ADVIL ,MOTRIN ) 600 MG tablet Take 600 mg by mouth as needed.     [provider]  magnesium oxide (MAG-OX) 400 MG tablet Take 400 mg by mouth daily.    [provider]  Menthol, Topical Analgesic, 4 % GEL Bengay    [provider]  Multiple Vitamins-Minerals (CEROVITE PO)  Take 1 tablet by mouth daily.     [provider]  nitrofurantoin (MACRODANTIN) 100 MG capsule Take 100 mg by mouth at bedtime.    [provider]  Norethindrone-Ethinyl Estradiol-Fe Biphas (LO LOESTRIN FE) 1 MG-10 MCG / 10 MCG tablet Take 1 tablet by mouth daily. 12/17/17   Signa Delon LABOR, NP  omeprazole (PRILOSEC) 20 MG capsule Take 20 mg by mouth daily.    [provider]  ondansetron (ZOFRAN) 4 MG tablet Take 4 mg by mouth every 8 (eight) hours as needed for nausea or vomiting.    [provider]  oxybutynin (DITROPAN-XL) 10 MG 24 hr tablet Take 10 mg by mouth at bedtime.    [provider]  Polyethyl Glycol-Propyl Glycol 0.4-0.3 % SOLN Place 1 drop into both eyes every 8 (eight) hours as needed.    [provider]  Propylene Glycol (SYSTANE BALANCE OP) Apply 1 drop to eye as needed.     [provider]  sertraline (ZOLOFT) 100 MG tablet  04/28/18   [provider]  sertraline (ZOLOFT) 25 MG tablet Take 150 mg by mouth daily.     [provider]    Allergies: Diamox [acetazolamide], Lactulose, and Latex    Review of Systems  Neurological:  Positive for headaches.    Updated Vital Signs BP 99/75   Pulse (!) 112   Temp 98.9 F (37.2 C) (  Oral)   Wt 59.4 kg   SpO2 96%   BMI 26.45 kg/m   Physical Exam  (all labs ordered are listed, but only abnormal results are displayed) Labs Reviewed  CBC WITH DIFFERENTIAL/PLATELET  BASIC METABOLIC PANEL WITH GFR  HCG, SERUM, QUALITATIVE    EKG: None  Radiology: No results found.   Procedures   Medications Ordered in the ED - No data to display                                  Medical Decision Making Differential diagnosis includes but not limited to tension headache, migraine headache, malfunction of VP shunt, meningitis, other  ED course: Patient presents for with 3 days of headache located related to the top of the head she denies any other  symptoms, no fevers, no neck stiffness, no numbness ting or weakness, no vision changes.  On exam she is tachycardic but afebrile and vitals otherwise normal.  Labs obtained and do show leukocytosis.  She has indwelling catheter, suspect possible UTI, given Rocephin, UA positive.  She does meet SIRS criteria, normal blood pressures, lactic acid pending.  Mild hypokalemia treated with oral repletion states that heart rate of 120 is not abnormal for her, she states she is chronically tachycardic.  She is not toxic in appearance.  She has no abdominal pain or flank pain. she does not have any meningeal signs on exam.  CT of her head does show enlarged ventricles consistent with possible shunt malfunction.   Patient follows with [facility] so I consulted neurosurgery there, and spoke with Dr. Fargen.  He is accepting ED to ED.  In for culture, discussed with nurse needs to be sent from new catheter.  UA was sent from catheter has been in place for a month, but did not new order so this can be resent..   Amount and/or Complexity of Data Reviewed Labs: ordered. Decision-making details documented in ED Course. Radiology: ordered.  Risk OTC drugs. Prescription drug management.        Final diagnoses:  None    ED Discharge Orders     None          Suellen Sherran LABOR, PA-C 03/26/24 [address] PA-C 03/26/24 1745    Francesca Elsie CROME, MD 03/26/24 2250

## 2024-03-27 LAB — BLOOD CULTURE ID PANEL (REFLEXED) - BCID2

## 2024-03-27 LAB — URINE CULTURE

## 2024-03-29 LAB — CULTURE, BLOOD (ROUTINE X 2): Special Requests: ADEQUATE

## 2024-03-31 LAB — CULTURE, BLOOD (ROUTINE X 2): Culture: NO GROWTH
